# Patient Record
Sex: Female | Born: 1950 | Race: White | Hispanic: No | Marital: Married | Smoking: Never smoker
Health system: Southern US, Community
[De-identification: ages and names within clinical notes are randomized; demographics above are authoritative.]

## PROBLEM LIST (undated history)

## (undated) DIAGNOSIS — F419 Anxiety disorder, unspecified: Secondary | ICD-10-CM

## (undated) DIAGNOSIS — K589 Irritable bowel syndrome without diarrhea: Secondary | ICD-10-CM

## (undated) DIAGNOSIS — I10 Essential (primary) hypertension: Secondary | ICD-10-CM

## (undated) DIAGNOSIS — R519 Headache, unspecified: Secondary | ICD-10-CM

## (undated) DIAGNOSIS — E785 Hyperlipidemia, unspecified: Secondary | ICD-10-CM

## (undated) DIAGNOSIS — E039 Hypothyroidism, unspecified: Secondary | ICD-10-CM

## (undated) DIAGNOSIS — M1990 Unspecified osteoarthritis, unspecified site: Secondary | ICD-10-CM

## (undated) DIAGNOSIS — C801 Malignant (primary) neoplasm, unspecified: Secondary | ICD-10-CM

## (undated) HISTORY — PX: BREAST EXCISIONAL BIOPSY: SUR124

## (undated) HISTORY — PX: BREAST BIOPSY: SHX20

## (undated) HISTORY — PX: FOOT SURGERY: SHX648

## (undated) HISTORY — PX: JOINT REPLACEMENT: SHX530

## (undated) HISTORY — PX: BREAST CYST ASPIRATION: SHX578

---

## 2004-06-11 ENCOUNTER — Ambulatory Visit: Payer: Self-pay | Admitting: Unknown Physician Specialty

## 2004-08-07 ENCOUNTER — Ambulatory Visit: Payer: Self-pay | Admitting: Podiatry

## 2004-08-14 ENCOUNTER — Ambulatory Visit: Payer: Self-pay | Admitting: Podiatry

## 2004-08-28 ENCOUNTER — Emergency Department: Payer: Self-pay | Admitting: Emergency Medicine

## 2004-09-18 ENCOUNTER — Emergency Department (HOSPITAL_COMMUNITY): Admission: EM | Admit: 2004-09-18 | Discharge: 2004-09-19 | Payer: Self-pay | Admitting: Emergency Medicine

## 2004-09-27 ENCOUNTER — Ambulatory Visit (HOSPITAL_COMMUNITY): Admission: RE | Admit: 2004-09-27 | Discharge: 2004-09-27 | Payer: Self-pay | Admitting: Gastroenterology

## 2005-07-08 ENCOUNTER — Ambulatory Visit: Payer: Self-pay | Admitting: Unknown Physician Specialty

## 2006-07-13 ENCOUNTER — Ambulatory Visit: Payer: Self-pay | Admitting: Unknown Physician Specialty

## 2007-09-14 ENCOUNTER — Ambulatory Visit: Payer: Self-pay | Admitting: Unknown Physician Specialty

## 2008-09-12 ENCOUNTER — Ambulatory Visit: Payer: Self-pay | Admitting: Unknown Physician Specialty

## 2008-09-20 ENCOUNTER — Ambulatory Visit: Payer: Self-pay | Admitting: Unknown Physician Specialty

## 2008-11-06 ENCOUNTER — Ambulatory Visit: Payer: Self-pay | Admitting: Cardiology

## 2008-11-06 ENCOUNTER — Ambulatory Visit: Payer: Self-pay | Admitting: General Practice

## 2008-11-20 ENCOUNTER — Ambulatory Visit: Payer: Self-pay | Admitting: General Practice

## 2009-08-18 ENCOUNTER — Ambulatory Visit: Payer: Self-pay

## 2009-09-21 ENCOUNTER — Ambulatory Visit: Payer: Self-pay | Admitting: General Practice

## 2009-10-02 ENCOUNTER — Ambulatory Visit: Payer: Self-pay | Admitting: Unknown Physician Specialty

## 2009-12-04 ENCOUNTER — Ambulatory Visit: Payer: Self-pay | Admitting: Gastroenterology

## 2009-12-06 LAB — PATHOLOGY REPORT

## 2010-11-21 ENCOUNTER — Ambulatory Visit: Payer: Self-pay | Admitting: Unknown Physician Specialty

## 2011-12-29 ENCOUNTER — Ambulatory Visit: Payer: Self-pay | Admitting: Physician Assistant

## 2012-08-22 ENCOUNTER — Emergency Department: Payer: Self-pay | Admitting: Emergency Medicine

## 2012-08-22 LAB — CK TOTAL AND CKMB (NOT AT ARMC)
CK, Total: 80 U/L (ref 21–215)
CK-MB: <0.5 ng/mL — ABNORMAL LOW (ref 0.5–3.6)

## 2012-08-22 LAB — CBC
MCH: 30.8 pg (ref 26.0–34.0)
MCV: 90 fL (ref 80–100)
RBC: 4.61 10*6/uL (ref 3.80–5.20)
RDW: 12.7 % (ref 11.5–14.5)
WBC: 12.3 10*3/uL — ABNORMAL HIGH (ref 3.6–11.0)

## 2012-08-22 LAB — COMPREHENSIVE METABOLIC PANEL
Albumin: 3.7 g/dL (ref 3.4–5.0)
BUN: 25 mg/dL — ABNORMAL HIGH (ref 7–18)
Chloride: 107 mmol/L (ref 98–107)
Creatinine: 0.94 mg/dL (ref 0.60–1.30)
Glucose: 115 mg/dL — ABNORMAL HIGH (ref 65–99)
Potassium: 2.9 mmol/L — ABNORMAL LOW (ref 3.5–5.1)
SGPT (ALT): 20 U/L (ref 12–78)
Total Protein: 6.8 g/dL (ref 6.4–8.2)

## 2013-01-04 ENCOUNTER — Ambulatory Visit: Payer: Self-pay | Admitting: Physician Assistant

## 2013-04-27 ENCOUNTER — Ambulatory Visit: Payer: Self-pay | Admitting: General Practice

## 2013-04-27 LAB — CBC
HCT: 41 % (ref 35.0–47.0)
HGB: 13.6 g/dL (ref 12.0–16.0)
MCH: 30.4 pg (ref 26.0–34.0)
MCHC: 33.2 g/dL (ref 32.0–36.0)
MCV: 91 fL (ref 80–100)
PLATELETS: 295 10*3/uL (ref 150–440)
RBC: 4.49 10*6/uL (ref 3.80–5.20)
RDW: 12.4 % (ref 11.5–14.5)
WBC: 7.5 10*3/uL (ref 3.6–11.0)

## 2013-04-27 LAB — URINALYSIS, COMPLETE
BILIRUBIN, UR: NEGATIVE
BLOOD: NEGATIVE
Glucose,UR: NEGATIVE mg/dL (ref 0–75)
Ketone: NEGATIVE
Leukocyte Esterase: NEGATIVE
NITRITE: NEGATIVE
Ph: 7 (ref 4.5–8.0)
Protein: NEGATIVE
SPECIFIC GRAVITY: 1.006 (ref 1.003–1.030)
WBC UR: 1 /HPF (ref 0–5)

## 2013-04-27 LAB — BASIC METABOLIC PANEL
ANION GAP: 5 — AB (ref 7–16)
BUN: 19 mg/dL — ABNORMAL HIGH (ref 7–18)
CO2: 29 mmol/L (ref 21–32)
CREATININE: 0.91 mg/dL (ref 0.60–1.30)
Calcium, Total: 9 mg/dL (ref 8.5–10.1)
Chloride: 104 mmol/L (ref 98–107)
EGFR (African American): >60
Glucose: 91 mg/dL (ref 65–99)
OSMOLALITY: 278 (ref 275–301)
Potassium: 4 mmol/L (ref 3.5–5.1)
SODIUM: 138 mmol/L (ref 136–145)

## 2013-04-27 LAB — PROTIME-INR
INR: 0.9
PROTHROMBIN TIME: 12.3 s (ref 11.5–14.7)

## 2013-04-27 LAB — MRSA PCR SCREENING

## 2013-04-27 LAB — APTT: ACTIVATED PTT: 29.8 s (ref 23.6–35.9)

## 2013-04-27 LAB — SEDIMENTATION RATE: ERYTHROCYTE SED RATE: 8 mm/h (ref 0–30)

## 2013-04-28 LAB — URINE CULTURE

## 2013-05-11 ENCOUNTER — Inpatient Hospital Stay: Payer: Self-pay | Admitting: General Practice

## 2013-05-12 LAB — PLATELET COUNT: Platelet: 231 10*3/uL (ref 150–440)

## 2013-05-12 LAB — HEMOGLOBIN: HGB: 11 g/dL — ABNORMAL LOW (ref 12.0–16.0)

## 2013-05-12 LAB — BASIC METABOLIC PANEL
Anion Gap: 6 — ABNORMAL LOW (ref 7–16)
BUN: 12 mg/dL (ref 7–18)
CO2: 27 mmol/L (ref 21–32)
CREATININE: 0.84 mg/dL (ref 0.60–1.30)
Calcium, Total: 7.9 mg/dL — ABNORMAL LOW (ref 8.5–10.1)
Chloride: 103 mmol/L (ref 98–107)
EGFR (African American): >60
EGFR (Non-African Amer.): >60
Glucose: 106 mg/dL — ABNORMAL HIGH (ref 65–99)
OSMOLALITY: 272 (ref 275–301)
POTASSIUM: 3.4 mmol/L — AB (ref 3.5–5.1)
Sodium: 136 mmol/L (ref 136–145)

## 2013-05-13 LAB — BASIC METABOLIC PANEL
ANION GAP: 5 — AB (ref 7–16)
BUN: 6 mg/dL — AB (ref 7–18)
CREATININE: 0.85 mg/dL (ref 0.60–1.30)
Calcium, Total: 7.8 mg/dL — ABNORMAL LOW (ref 8.5–10.1)
Chloride: 103 mmol/L (ref 98–107)
Co2: 26 mmol/L (ref 21–32)
EGFR (African American): >60
EGFR (Non-African Amer.): >60
GLUCOSE: 114 mg/dL — AB (ref 65–99)
Osmolality: 267 (ref 275–301)
POTASSIUM: 3.5 mmol/L (ref 3.5–5.1)
Sodium: 134 mmol/L — ABNORMAL LOW (ref 136–145)

## 2013-05-13 LAB — HEMOGLOBIN: HGB: 10.6 g/dL — ABNORMAL LOW (ref 12.0–16.0)

## 2013-05-13 LAB — PLATELET COUNT: PLATELETS: 211 10*3/uL (ref 150–440)

## 2013-06-03 ENCOUNTER — Ambulatory Visit
Admission: RE | Admit: 2013-06-03 | Discharge: 2013-06-03 | Disposition: A | Discharge status: Home / Self Care | Payer: BC Managed Care – PPO | Source: Ambulatory Visit | Attending: Gastroenterology | Admitting: Gastroenterology

## 2013-06-03 ENCOUNTER — Other Ambulatory Visit: Payer: Self-pay | Admitting: Gastroenterology

## 2013-06-03 DIAGNOSIS — R197 Diarrhea, unspecified: Secondary | ICD-10-CM

## 2013-06-03 DIAGNOSIS — R109 Unspecified abdominal pain: Secondary | ICD-10-CM

## 2013-06-03 MED ORDER — IOHEXOL 300 MG/ML  SOLN
100.0000 mL | Freq: Once | INTRAMUSCULAR | Status: AC | PRN
  Administered 2013-06-03: 100 mL via INTRAVENOUS

## 2013-08-04 ENCOUNTER — Ambulatory Visit: Payer: Self-pay | Admitting: General Practice

## 2013-08-04 LAB — PROTIME-INR
INR: 1
Prothrombin Time: 12.6 secs (ref 11.5–14.7)

## 2013-08-04 LAB — BASIC METABOLIC PANEL
ANION GAP: 8 (ref 7–16)
BUN: 16 mg/dL (ref 7–18)
CHLORIDE: 105 mmol/L (ref 98–107)
CREATININE: 1.04 mg/dL (ref 0.60–1.30)
Calcium, Total: 8.7 mg/dL (ref 8.5–10.1)
Co2: 26 mmol/L (ref 21–32)
EGFR (African American): >60
GFR CALC NON AF AMER: 58 — AB
GLUCOSE: 88 mg/dL (ref 65–99)
Osmolality: 278 (ref 275–301)
Potassium: 4.1 mmol/L (ref 3.5–5.1)
Sodium: 139 mmol/L (ref 136–145)

## 2013-08-04 LAB — CBC
HCT: 39.8 % (ref 35.0–47.0)
HGB: 12.7 g/dL (ref 12.0–16.0)
MCH: 29 pg (ref 26.0–34.0)
MCHC: 32 g/dL (ref 32.0–36.0)
MCV: 91 fL (ref 80–100)
Platelet: 360 10*3/uL (ref 150–440)
RBC: 4.39 10*6/uL (ref 3.80–5.20)
RDW: 13.2 % (ref 11.5–14.5)
WBC: 8.2 10*3/uL (ref 3.6–11.0)

## 2013-08-04 LAB — URINALYSIS, COMPLETE
BACTERIA: NONE SEEN
BLOOD: NEGATIVE
Bilirubin,UR: NEGATIVE
Glucose,UR: NEGATIVE mg/dL (ref 0–75)
KETONE: NEGATIVE
LEUKOCYTE ESTERASE: NEGATIVE
Nitrite: NEGATIVE
Ph: 6 (ref 4.5–8.0)
Protein: NEGATIVE
Specific Gravity: 1.013 (ref 1.003–1.030)

## 2013-08-04 LAB — APTT: Activated PTT: 29 secs (ref 23.6–35.9)

## 2013-08-04 LAB — SEDIMENTATION RATE: Erythrocyte Sed Rate: 18 mm/hr (ref 0–30)

## 2013-08-04 LAB — MRSA PCR SCREENING

## 2013-08-05 LAB — URINE CULTURE

## 2013-08-15 ENCOUNTER — Inpatient Hospital Stay: Payer: Self-pay | Admitting: General Practice

## 2013-08-16 LAB — BASIC METABOLIC PANEL
Anion Gap: 10 (ref 7–16)
BUN: 9 mg/dL (ref 7–18)
CALCIUM: 7.4 mg/dL — AB (ref 8.5–10.1)
CHLORIDE: 101 mmol/L (ref 98–107)
CREATININE: 0.89 mg/dL (ref 0.60–1.30)
Co2: 22 mmol/L (ref 21–32)
EGFR (African American): >60
Glucose: 112 mg/dL — ABNORMAL HIGH (ref 65–99)
Osmolality: 266 (ref 275–301)
Potassium: 3.7 mmol/L (ref 3.5–5.1)
Sodium: 133 mmol/L — ABNORMAL LOW (ref 136–145)

## 2013-08-16 LAB — HEMOGLOBIN: HGB: 10.7 g/dL — AB (ref 12.0–16.0)

## 2013-08-16 LAB — PLATELET COUNT: PLATELETS: 241 10*3/uL (ref 150–440)

## 2013-08-17 LAB — BASIC METABOLIC PANEL
Anion Gap: 6 — ABNORMAL LOW (ref 7–16)
BUN: 5 mg/dL — AB (ref 7–18)
CHLORIDE: 98 mmol/L (ref 98–107)
Calcium, Total: 8.1 mg/dL — ABNORMAL LOW (ref 8.5–10.1)
Co2: 29 mmol/L (ref 21–32)
Creatinine: 0.85 mg/dL (ref 0.60–1.30)
EGFR (African American): >60
EGFR (Non-African Amer.): >60
Glucose: 126 mg/dL — ABNORMAL HIGH (ref 65–99)
OSMOLALITY: 265 (ref 275–301)
Potassium: 3.1 mmol/L — ABNORMAL LOW (ref 3.5–5.1)
Sodium: 133 mmol/L — ABNORMAL LOW (ref 136–145)

## 2013-08-17 LAB — PLATELET COUNT: Platelet: 284 10*3/uL (ref 150–440)

## 2013-08-17 LAB — HEMOGLOBIN: HGB: 11.3 g/dL — AB (ref 12.0–16.0)

## 2013-08-18 LAB — BASIC METABOLIC PANEL
ANION GAP: 9 (ref 7–16)
BUN: 8 mg/dL (ref 7–18)
CHLORIDE: 102 mmol/L (ref 98–107)
CO2: 27 mmol/L (ref 21–32)
Calcium, Total: 8.2 mg/dL — ABNORMAL LOW (ref 8.5–10.1)
Creatinine: 1.1 mg/dL (ref 0.60–1.30)
EGFR (African American): >60
EGFR (Non-African Amer.): 54 — ABNORMAL LOW
GLUCOSE: 101 mg/dL — AB (ref 65–99)
Osmolality: 274 (ref 275–301)
POTASSIUM: 3.4 mmol/L — AB (ref 3.5–5.1)
Sodium: 138 mmol/L (ref 136–145)

## 2014-02-22 ENCOUNTER — Ambulatory Visit: Payer: Self-pay | Admitting: Physician Assistant

## 2014-02-28 ENCOUNTER — Ambulatory Visit: Payer: Self-pay | Admitting: Physician Assistant

## 2014-04-29 NOTE — Op Note (Signed)
PATIENT NAME:  Sonya Gomez, Sonya Gomez MR#:  [id number] DATE OF BIRTH:  12-07-1950  DATE OF PROCEDURE:  08/15/2013  PREOPERATIVE DIAGNOSIS: Degenerative arthrosis of the left knee.   POSTOPERATIVE DIAGNOSIS: Degenerative arthrosis of the left knee.   PROCEDURE PERFORMED: Left total knee arthroplasty using computer-assisted navigation.   SURGEON: Skip Estimable, M.D.   ASSISTANT: Vance Peper, PA (required to maintain retraction throughout the procedure)   ANESTHESIA: Spinal.   ESTIMATED BLOOD LOSS: 50 mL.   FLUIDS REPLACED: 1300 mL of crystalloid.   TOURNIQUET TIME: 84 minutes.   DRAINS: Two medium drains to reinfusion system.   SOFT TISSUE RELEASES: Anterior cruciate ligament, posterior cruciate ligament, deep and superficial medial collateral ligament, and patellofemoral ligament.   IMPLANTS UTILIZED: DePuy PFC Sigma size 3 posterior stabilized femoral component (cemented), size 2.5 MBT tibial component (cemented), 35 mm three-peg oval dome patella (cemented), and a 10 mm stabilized rotating platform polyethylene insert.   INDICATIONS FOR SURGERY: The patient is a 64 year old female who has been seen for complaints of progressive left knee pain. The patient previously underwent right total knee arthroplasty for degenerative arthrosis with excellent relief of her pain. Radiographs of the left knee demonstrated degenerative changes in tricompartmental fashion with relative varus deformity. After discussion of the risks and benefits of surgical intervention, the patient expressed understanding of the risks and benefits and agreed with plans for surgical intervention.   PROCEDURE IN DETAIL: The patient was brought into the operating room and, after adequate spinal anesthesia was achieved, a tourniquet was placed on the patient's upper left thigh. The patient's left knee and leg were cleaned and prepped with alcohol and DuraPrep and draped in the usual sterile fashion. A "timeout" was performed as per  usual protocol. The left lower extremity was exsanguinated using an Esmarch and the tourniquet was inflated to 300 mmHg. An anterior longitudinal incision was made followed by a standard mid vastus approach. A small effusion was evacuated. The deep fibers of the medial collateral ligament were elevated in subperiosteal fashion off the medial flare of the tibia so as to maintain a continuous soft tissue sleeve. The patella was subluxed laterally and the patellofemoral ligament was incised. Inspection of the knee demonstrated severe degenerative changes with full-thickness loss of articular cartilage to the medial compartment as well as lesser degenerative changes to the patellofemoral and lateral compartments. Osteophytes were debrided using a rongeur. Anterior and posterior cruciate ligaments were excised. Two 4.0 mm Schanz pins were inserted into the femur and into the tibia for attachment of the array of trackers used for computer-assisted navigation. Hip center was identified using a circumduction technique. Distal landmarks were mapped using the computer. The distal femur and proximal tibia were mapped using the computer. Distal femoral cutting guide was positioned using computer-assisted navigation so as to achieve a 5 degree distal valgus cut. The cut was performed and verified using the computer. Distal femur was sized and it was felt that a size 3 femoral component was appropriate. A size 3 cutting guide was positioned and anterior cut was performed and verified using the computer. This was followed by completion of the posterior and chamfer cuts. Femoral cutting guide for the central box was then positioned and the central box cut was performed. Attention was then directed to the proximal tibia. Medial and lateral menisci were excised. The extramedullary tibial cutting guide was positioned using computer-assisted navigation so as to achieve 0 degree varus valgus alignment and 0 degree posterior slope. Cut  was performed and  verified using the computer. The proximal tibia was sized and it was felt that a size 2.5 tibial tray was appropriate. Tibial and femoral trials were inserted followed by insertion of a 10 mm polyethylene trial. The knee was felt to be tight medially. A Cobb elevator was used to elevate the superficial fibers of the medial collateral ligament. This allowed for excellent mediolateral soft tissue balancing both in full extension and in flexion. Finally, the patella was cut and prepared so as to accommodate a 35 mm three-peg oval dome patella. Patellar trial was placed and the knee was placed through a range of motion with excellent patellar tracking appreciated. The femoral trial was removed. Central post hole for the tibial component was reamed followed by insertion of a keel punch. Tibial trial was then removed. The cut surfaces of bone were irrigated with copious amounts of normal saline with antibiotic solution using pulsatile lavage and then suctioned dry. Polymethyl methacrylate cement was prepared in the usual fashion using a vacuum mixer. Cement was applied to the cut surface of the proximal tibia as well as along the undersurface of a size 2.5 MBT tibial component. The tibial component was positioned and impacted into place. Excess cement was removed using [employer] fast streamer. Cement was then applied to the cut surface of the femur as well as along the posterior flanges of a size 3 posterior stabilized femoral component. Femoral component was positioned and impacted into place. Excess cement was removed using freer elevators. A 10 mm polyethylene trial was inserted and the knee was brought into full extension with steady axial compression applied. Finally, cement was applied to the backside of a 35 mm three-peg oval dome patella and the patellar component was positioned and patellar clamp applied. Excess cement was removed using [employer] fast streamer.   After adequate curing of cement, the  tourniquet was deflated after total tourniquet time of 84 minutes. Hemostasis was achieved using electrocautery. The knee was irrigated with copious amounts of normal saline with antibiotic solution using pulsatile lavage and then suctioned dry. The knee was inspected for any residual cement debris. Then 20 mL of 1.3% Exparel in 40 mL of normal saline was injected along the posterior capsule, medial and lateral gutters, and along the arthrotomy site. A 10 mm stabilized rotating platform polyethylene insert was inserted and the knee was placed through a range of motion with excellent patellar tracking appreciated and excellent mediolateral soft tissue balancing noted. Two medium drains were placed in the wound bed and brought out through a separate stab incision to be attached to a reinfusion system. The medial parapatellar portion of the incision was reapproximated using interrupted sutures of #1 Vicryl. The subcutaneous tissue was approximated in layers using first #0 Vicryl followed by #2-0 Vicryl. Then 30 mL of 0.25% Marcaine with epinephrine was injected along the incision site in the subcutaneous tissue. Skin was closed with skin staples. A sterile dressing was applied.   The patient tolerated the procedure well. She was transported to the recovery room in stable condition. ____________________________ Laurice Record. Holley Bouche., MD jph:sb D: 08/15/2013 11:27:20 ET T: 08/15/2013 11:47:41 ET JOB#: [id number]  cc: Jeneen Rinks P. Holley Bouche., MD, <Dictator> Laurice Record Holley Bouche MD ELECTRONICALLY SIGNED 08/17/2013 21:47

## 2014-04-29 NOTE — Op Note (Signed)
PATIENT NAME:  Sonya Gomez, Sonya Gomez MR#:  [id number] DATE OF BIRTH:  Jan 09, 1950  DATE OF PROCEDURE:  05/11/2013  PREOPERATIVE DIAGNOSIS: Degenerative arthrosis of the right knee.   POSTOPERATIVE DIAGNOSIS: Degenerative arthrosis of the right knee.   PROCEDURE PERFORMED: Right total knee arthroplasty using computer-assisted navigation.   SURGEON: Dr. Skip Estimable  ASSISTANT:  Vance Peper, PA (required to maintain retraction throughout the procedure)  ANESTHESIA: Spinal.   ESTIMATED BLOOD LOSS: 50 mL.   FLUIDS REPLACED: 1500 mL of crystalloid.   TOURNIQUET TIME: 115 minutes.   DRAINS: Two medium drains, reinfusion system.   SOFT TISSUE RELEASES: Anterior cruciate ligament, posterior cruciate ligament, deep and superficial medial collateral ligament, patellofemoral ligament.   IMPLANTS UTILIZED: DePuy PFC Sigma size 2.5 posterior stabilized femoral component (cemented), size 2.5 MBT tibial component (cemented), 32 mm 3 peg oval dome patella (cemented), and a 10 mm stabilized rotating platform polyethylene insert.   INDICATIONS FOR SURGERY: The patient is a 64 year old female who has been seen for complaints of progressive right knee pain. X-rays demonstrated severe degenerative changes in tricompartmental fashion with slight varus deformity. After discussion of the risks and benefits of surgical intervention, the patient expressed understanding of the risks and benefits and agreed with plans for surgical intervention.   PROCEDURE IN DETAIL: The patient was brought into the operating room and, after adequate spinal anesthesia was achieved, a tourniquet was placed on the patient's upper right thigh. The patient's right knee and leg were cleaned and prepped with alcohol and DuraPrep draped in the usual sterile fashion. A "timeout" was performed as per usual protocol. The right lower extremity was exsanguinated using an Esmarch, the tourniquet was inflated to 300 mmHg. An anterior longitudinal incision  was made followed by a standard mid vastus approach. A moderate effusion was evacuated. The deep fibers of the medial collateral ligament were elevated in a subperiosteal fashion off the medial flare of the tibia so as to maintain a continuous soft tissue sleeve. The patella was subluxed laterally and the patellofemoral ligament was incised. Inspection of the knee demonstrated severe degenerative changes in tricompartmental fashion with full-thickness loss of articular cartilage noted. Prominent osteophytes were debrided using a rongeur. Anterior and posterior cruciate ligaments were excised. Two 4.0 mm Schanz pins were inserted into the femur and into the tibia for attachment of the array of trackers used for computer-assisted navigation. Hip center was identified using circumduction technique. Distal landmarks were mapped using the computer. The distal femur and proximal tibia were mapped using the computer. Distal femoral cutting guide was positioned using computer-assisted navigation so as to achieve a 5 degree distal valgus cut. Cut was performed and verified using the computer. Distal femur was sized and it was felt that a size 2.5 femur was appropriate.  A size 2.5 cutting guide was positioned and the anterior cut was performed and verified using the computer. This was followed by completion of the posterior and chamfer cuts. Femoral cutting guide for the central box was then positioned and the central box cut was performed. Attention was then directed to the proximal tibia. Medial and lateral menisci were excised. The extramedullary tibial cutting guide was positioned using computer-assisted navigation so as to achieve 0 degree varus and valgus alignment and 0 degree posterior slope. Cut was performed and verified using the computer. The proximal tibia was sized and it was felt that a size 2.5 tibial tray was appropriate. Tibial and femoral trials were inserted followed by insertion of a 10 mm polyethylene  trial. The knee was felt to be tight medially. A Cobb elevator was used to elevate the superficial fibers of the medial collateral ligament. This improved soft tissue balance, but there was still felt to be a varus alignment appreciated as per computer readings. Trial components were removed and the bony ends were placed end to end. Additional verification was performed of the tibial cut and of the distal femoral cut. Distal femoral cut unlike initial findings show that there was slight varus alignment consistent with the varus alignment noted with the implants place. The distal femoral cutting guide was repositioned using computer-assisted navigation and thin wafer of bone was removed from the lateral femoral condyle.  Three-in-one cutting block was repositioned and the chamfer cuts were recut.  The box cut was also recut. Bones were placed end to end and excellent alignment was appreciated with 0 degrees noted. Trial components were reinserted followed by insertion of a 10 mm polyethylene trial. Excellent alignment was appreciated and excellent mediolateral soft tissue balance was appreciated.  Finally, the  patella was cut and prepared so as to accommodate a 32 mm 3 peg oval dome patella. Patellar trial was placed and the knee was placed through a range of motion with excellent patellar tracking appreciated.   Femoral trial was removed. Central post hole for the tibial component was reamed followed by insertion of a keel punch. Tibial trial was then removed. Cut surfaces of bone were irrigated with copious amounts of normal saline with antibiotic solution using pulsatile lavage and then suctioned dry. Polymethyl methacrylate cement was prepared in the usual fashion using a vacuum mixer. Cement was applied to the cut surface of the proximal tibia as well as along the under surface of size 2.5 MBT tibial component. The tibial component was positioned and impacted into place. Excess cement was removed using Air cabin crew. Cement was then applied to the cut surface of the femur as well as on the posterior phalanges of a size 2.5 posterior stabilized femoral component.  The femoral component was positioned and impacted into place. Excess cement was removed using [employer] fast streamer. A 10 mm polyethylene insert was placed and it was brought into full extension with steady axial compression applied. Finally, cement was applied to the backside of a 32 mm 3 peg oval dome patella and the patellar component was positioned and patellar clamp applied. Excess cement was removed using [employer] fast streamer.   After adequate curing of cement, the tourniquet was deflated after a total tourniquet time of 115 minutes. Hemostasis was achieved using electrocautery. The knee was irrigated with copious amounts of normal saline with antibiotic solution using pulsatile lavage and suctioned dry. The knee was inspected for any residual cement debris, 20 mL of 1.3% Exparel and 40 mL of normal saline was injected along the posterior capsule, medial and lateral gutters, and along the arthrotomy site. A 10 mm stabilized rotating platform polyethylene insert was inserted and the knee was placed through a range of motion with excellent patellar tracking appreciated and excellent mediolateral soft tissue balancing noted both in full extension and in flexion. Two medium drains were placed in the wound bed and brought in through a separate stab incision to be attached to a reinfusion system. The medial parapatellar portion of the incision was reapproximated using interrupted sutures of #1 Vicryl. The subcutaneous tissue was approximated in layers using first #0 Vicryl, followed by 2-0 Vicryl. Then, 30 mL of 0.25% Marcaine with epinephrine was injected into the subcutaneous tissue in line  with the skin incision. The skin was then reapproximated using skin staples. Sterile dressing was applied. The patient tolerated the procedure well. She was transported to the  recovery room in stable condition.    ____________________________ Laurice Record. Holley Bouche., MD jph:dd/am D: 05/11/2013 16:00:00 ET T: 05/12/2013 00:27:27 ET JOB#: [id number]  cc: Laurice Record. Holley Bouche., MD, <Dictator> JAMES P Holley Bouche MD ELECTRONICALLY SIGNED 05/15/2013 23:10

## 2014-04-29 NOTE — Discharge Summary (Signed)
PATIENT NAME:  CHE, BELOW MR#:  [id number] DATE OF BIRTH:  1950-07-08  DATE OF ADMISSION:  05/11/2013 DATE OF DISCHARGE:  05/14/2013  ADMITTING DIAGNOSIS: Degenerative arthrosis of the right knee.   DISCHARGE DIAGNOSIS: Degenerative arthrosis of the right knee.   HISTORY: The patient is a pleasant 64 year old who has been following at [facility] for progression of right knee discomfort. She had undergone a right knee arthroscopy in 2011 for a partial medial meniscectomy as well as chondroplasty. She has also undergone Synvisc injections with only modest improvement. The patient had localized most of her pain along the medial aspect of the knee. Her pain was noted be aggravated with weight-bearing activities. She has also reported some start of stiffness and occasional activity-related swelling. At the time of surgery, she was not using any ambulatory aid. She had not seen any significant improvement in her condition despite the use of Celebrex and occasionally the use of narcotics for pain control. She states that the pain had increased to the point that it was significantly interfering with her activities of daily living. X-rays taken at [facility] showed narrowing of the medial cartilage space with associated varus alignment. She was noted to have osteophyte as well as subchondral sclerosis. After discussion of the risks and benefits of surgical intervention, the patient expressed her understanding of the risks and benefits and agreed for plans for surgical intervention.   PROCEDURE: Right total knee arthroplasty using computer-assisted navigation.   ANESTHESIA: Spinal.   SOFT TISSUE RELEASE: Anterior cruciate ligament, posterior cruciate ligament, deep and superficial medial collateral ligaments, as well as the patellofemoral ligament.   IMPLANTS UTILIZED: DePuy PFC Sigma size 2.5 posterior stabilized femoral component cemented, size 2.5 MBT tibial component cemented,  32-mm 3-pegged oval dome patella cemented, and a 10-mm stabilized rotating platform polyethylene insert.   HOSPITAL COURSE: The patient tolerated the procedure very well. She had no complications. She was then taken to the PACU where she was stabilized then transferred to the orthopedic floor. She began receiving anticoagulation therapy of Lovenox 30 mg subcutaneous q. 12 hours per anesthesia and pharmacy protocol. She was fitted with TED stockings bilaterally. These were allowed to be removed 1 hour per 8-hour shift. The right one was applied on day 2 following removal of the Hemovac and dressing change. The wound was free of any drainage or signs of infection. The patient was also fitted with the AV-I compression foot pumps bilaterally set at 80 mmHg. Her calves have been nontender. There has been no evidence of any DVTs. Negative Homans sign. Heels were elevated off the bed using rolled towels.   The patient has denied any chest pains or shortness of breath. Vital signs have been stable. She has been afebrile. Hemodynamically she was stable and no transfusions were needed the Autovac transfusions given the first 6 hours postoperatively.   Physical therapy was initiated on day 1 for gait training and transfers. Upon being discharged was ambulating greater than 200 feet. She was independent with bed to chair transfers, was able go up 4 steps. Occupational therapy was also initiated on day 1 for ADL and assistive devices.   The patient's IV, Foley, and Hemovac were discontinued on day 2 along with the dressing change. The Polar Care was reapplied to the surgical leg, maintaining a temperature of 40-50 degrees Fahrenheit. Overall, it has been an unremarkable hospital course.   DRUG ALLERGIES: CECLOR, CRESTOR, TORADOL.   The patient is being discharged to home in improved  stable condition.   DISCHARGE INSTRUCTIONS: She may weight bear as tolerated. Continue using a walker until cleared by physical  therapy to go to a quad cane. She will receive home health PT. She is instructed on elevation of the lower extremity. Continue TED stockings. These are to be worn during the day but may be removed at night. Continue the Polar Care maintaining a temperature of 40-50 degrees Fahrenheit as much as she can for the first 2 weeks around-the-clock. Encouraged incentive spirometer q. 1 while awake, was also encouraged cough, deep breathing q. 2 hours while awake. Placed on a regular diet. She was instructed on wound care. She has a followup appointment on 05/21 at 8:45. She is to call the clinic sooner for any temperatures of 101.5 or greater or excessive bleeding.   The patient may resume her regular medication that she was on prior to admission. She was given a prescription for all hydrocodone 5 -10 mg q. 4-6 hours p.r.n. for pain. Tramadol 50-100 mg every 4-6 hours p.r.n. for pain and Lovenox 40 mg subcutaneously daily for 14 days, then discontinue and begin taking one 81-mg enteric-coated aspirin.   PAST MEDICAL HISTORY: Hypertension, hyperlipidemia, hypothyroidism, anxiety, arthritis, migraines, irritable bowel syndrome, fibrocystic breast disease, diverticulosis, endometriosis, colonic polyps.    ____________________________ Vance Peper, PA jrw:lt D: 05/31/2013 20:41:35 ET T: 05/31/2013 21:23:24 ET JOB#: [id number]  cc: Vance Peper, PA, <Dictator> JON WOLFE PA ELECTRONICALLY SIGNED 06/08/2013 7:02

## 2014-04-29 NOTE — Discharge Summary (Signed)
PATIENT NAME:  Sonya Sonya Gomez, Sonya Gomez MR#:  [id number] DATE OF BIRTH:  [birth year]  DATE OF ADMISSION:  08/15/2013 DATE OF DISCHARGE:  08/18/2013  ADMITTING DIAGNOSIS: Degenerative arthrosis of left knee.   DISCHARGE DIAGNOSIS: Degenerative arthrosis of the left knee.   HISTORY: The patient is a 64 year old who has been followed at [facility] for  discomfort to bilateral knees. She had previously undergone a right total knee arthroplasty and had done extremely well. This was performed on 05/11/2013. However, the left knee had continued to have discomfort. She desired to have surgery on the left knee. She states that she had a long history of progressive left knee pain. The pain was aggravated with weight-bearing activities. She had localized most of the pain along the medial aspect of the knee. On occasion, the patient had noted some swelling as well as near giving way. She had not appreciated any significant improvement in her condition despite the use of Celebrex as well as activity modification. At the time of surgery, she was not using any ambulatory aid. X-rays taken in [place] showed narrowing of the medial cartilage space with associated varus alignment. Osteophyte as well as subchondral sclerosis were noted. Degenerative changes was present to the patellofemoral articulation as well. After discussion of the risks and benefits of surgical intervention, the patient expressed her understanding of the risks and benefits and agreed for plans for surgical intervention.   PROCEDURE: Left total knee arthroplasty using computer-assisted navigation.   ANESTHESIA: Spinal.   SOFT TISSUE RELEASE: Anterior cruciate ligament, posterior cruciate ligament, deep and superficial medial collateral ligament, as well as patellofemoral ligament.   IMPLANTS UTILIZED: DePuy PFC Sigma size 3 posterior stabilized femoral component (cemented), size 2.5, MBT tibial component (cemented), 35 mm 3 pegged,  oval dome patella (cemented), and a 10 mm stabilized rotating platform polyethylene insert.   HOSPITAL COURSE: The patient tolerated the procedure very well. She had no complications. She was then taken to the PAC-U where she was stabilized and then transferred to the orthopedic floor. The patient began receiving anticoagulation therapy of Lovenox 30 mg subcutaneous q.  12 hours per anesthesia and pharmacy protocol. She was fitted with TED stockings bilaterally. These were allowed to be removed 1 hour per 8 hour shift. The left 1 was applied on day #2 following removal of the Hemovac and dressing change. The patient was also fitted with AVI compression foot pumps bilaterally set at 80 mmHg. Her calves have been nontender. There has been no evidence of any DVTs. Negative Homans sign. Heels were elevated off the bed using rolled towels.   The patient has denied any chest pain or shortness of breath. Vital signs have been stable. She has been afebrile. Hemodynamically, she was stable and no transfusions were needed other than the Autovac transfusion given the first 6 hours postoperatively.   The patient began receiving physical therapy on day #1. She has done extremely well. Upon being discharged was ambulating greater than 200 feet. She was independent with bed to chair transfers, was able go up and down 4 steps. Occupational therapy was also initiated on day #1 for ADL assistive devices.   The patient's IV, Foley and Hemovac were discontinued on day #2 along with a dressing change. The wound was free of any drainage or signs of infection. The Polar Care was reapplied to the surgical leg, maintaining a temperature of 40-50 degrees Fahrenheit.   DISPOSITION: The patient is discharged to home in improved and stable condition.  DISCHARGE INSTRUCTIONS: She may continue weight-bearing as tolerated. Continue to use the walker until cleared by physical therapy to go to a quad cane. She will receive home health  PT. She is to continue with TED stockings, bilaterally. These are to be worn during the day, but may be removed at night. Continue Polar Care maintaining a temperature of 40-50 degrees Fahrenheit. Recommend that she wear this around-the-clock as much as she can for the first 2 weeks. Elevate the heels off the bed. Encourage the patient to continue using incentive spirometer q. 1 hour while awake. I also encouraged her to continue cough and deep breathing q. 2 hours while awake. She is placed on a regular diet. She has a follow-up appointment at [facility] for August the 25th at 8:30. She is to call the clinic sooner if any temperatures of 101.5 or greater or excessive bleeding.   DRUG ALLERGIES: Crestor, Ceclor, and Toradol.   The patient may resume her regular medication that she was on prior to admission. She was given a prescription for Norco 5-10 mg every 4-6 hours p.r.n. for pain as well as tramadol 50-100 mg every 4-6 hours p.r.n. for pain. Also a prescription for Lovenox 40 mg subcutaneously daily for 14 days, then discontinue and begin taking one 81 mg enteric-coated aspirin.   PAST MEDICAL HISTORY: Hypertension, hyperlipidemia, hypothyroidism, anxiety, arthritis, migraine, irritable bowel syndrome, fibrocystic breast disease, diverticulosis, endometriosis, history of colonic polyps    ____________________________ Vance Peper, PA jrw:[id number] D: 08/26/2013 08:12:00 ET T: 08/26/2013 08:38:19 ET JOB#: [id number]  cc: Vance Peper, PA, <Dictator> [name] PA ELECTRONICALLY SIGNED 09/01/2013 8:50

## 2015-02-21 ENCOUNTER — Other Ambulatory Visit: Payer: Self-pay | Admitting: Physician Assistant

## 2015-02-21 DIAGNOSIS — R928 Other abnormal and inconclusive findings on diagnostic imaging of breast: Secondary | ICD-10-CM

## 2015-02-21 DIAGNOSIS — Z0000 Encounter for general adult medical examination without abnormal findings: Secondary | ICD-10-CM

## 2015-02-21 DIAGNOSIS — Z1239 Encounter for other screening for malignant neoplasm of breast: Secondary | ICD-10-CM

## 2015-03-07 ENCOUNTER — Other Ambulatory Visit: Payer: Self-pay | Admitting: Physician Assistant

## 2015-03-07 ENCOUNTER — Ambulatory Visit
Admission: RE | Admit: 2015-03-07 | Discharge: 2015-03-07 | Disposition: A | Discharge status: Home / Self Care | Payer: BLUE CROSS/BLUE SHIELD | Source: Ambulatory Visit | Attending: Physician Assistant | Admitting: Physician Assistant

## 2015-03-07 DIAGNOSIS — Z0000 Encounter for general adult medical examination without abnormal findings: Secondary | ICD-10-CM

## 2015-03-07 DIAGNOSIS — N6459 Other signs and symptoms in breast: Secondary | ICD-10-CM | POA: Insufficient documentation

## 2015-03-07 DIAGNOSIS — R928 Other abnormal and inconclusive findings on diagnostic imaging of breast: Secondary | ICD-10-CM

## 2015-03-07 DIAGNOSIS — Z1239 Encounter for other screening for malignant neoplasm of breast: Secondary | ICD-10-CM

## 2015-03-07 HISTORY — DX: Malignant (primary) neoplasm, unspecified: C80.1

## 2015-03-09 ENCOUNTER — Other Ambulatory Visit: Payer: Self-pay | Admitting: Physician Assistant

## 2015-03-09 DIAGNOSIS — R928 Other abnormal and inconclusive findings on diagnostic imaging of breast: Secondary | ICD-10-CM

## 2015-03-15 ENCOUNTER — Ambulatory Visit
Admission: RE | Admit: 2015-03-15 | Discharge: 2015-03-15 | Disposition: A | Discharge status: Home / Self Care | Payer: BLUE CROSS/BLUE SHIELD | Source: Ambulatory Visit | Attending: Physician Assistant | Admitting: Physician Assistant

## 2015-03-15 DIAGNOSIS — N6489 Other specified disorders of breast: Secondary | ICD-10-CM | POA: Diagnosis not present

## 2015-03-15 DIAGNOSIS — R928 Other abnormal and inconclusive findings on diagnostic imaging of breast: Secondary | ICD-10-CM

## 2015-03-15 DIAGNOSIS — N62 Hypertrophy of breast: Secondary | ICD-10-CM | POA: Diagnosis not present

## 2015-03-16 LAB — SURGICAL PATHOLOGY

## 2016-02-21 DIAGNOSIS — E039 Hypothyroidism, unspecified: Secondary | ICD-10-CM | POA: Diagnosis not present

## 2016-02-21 DIAGNOSIS — Z0000 Encounter for general adult medical examination without abnormal findings: Secondary | ICD-10-CM | POA: Diagnosis not present

## 2016-02-21 DIAGNOSIS — I10 Essential (primary) hypertension: Secondary | ICD-10-CM | POA: Diagnosis not present

## 2016-02-21 DIAGNOSIS — E782 Mixed hyperlipidemia: Secondary | ICD-10-CM | POA: Diagnosis not present

## 2016-02-28 ENCOUNTER — Other Ambulatory Visit: Payer: Self-pay | Admitting: Physician Assistant

## 2016-02-28 DIAGNOSIS — Z1231 Encounter for screening mammogram for malignant neoplasm of breast: Secondary | ICD-10-CM

## 2016-02-28 DIAGNOSIS — E039 Hypothyroidism, unspecified: Secondary | ICD-10-CM | POA: Diagnosis not present

## 2016-02-28 DIAGNOSIS — Z0000 Encounter for general adult medical examination without abnormal findings: Secondary | ICD-10-CM | POA: Diagnosis not present

## 2016-02-28 DIAGNOSIS — N6019 Diffuse cystic mastopathy of unspecified breast: Secondary | ICD-10-CM | POA: Diagnosis not present

## 2016-02-28 DIAGNOSIS — Z79890 Hormone replacement therapy (postmenopausal): Secondary | ICD-10-CM | POA: Diagnosis not present

## 2016-02-28 DIAGNOSIS — F4320 Adjustment disorder, unspecified: Secondary | ICD-10-CM | POA: Diagnosis not present

## 2016-02-28 DIAGNOSIS — E782 Mixed hyperlipidemia: Secondary | ICD-10-CM | POA: Diagnosis not present

## 2016-02-28 DIAGNOSIS — I10 Essential (primary) hypertension: Secondary | ICD-10-CM | POA: Diagnosis not present

## 2016-03-27 ENCOUNTER — Ambulatory Visit
Admission: RE | Admit: 2016-03-27 | Discharge: 2016-03-27 | Disposition: A | Discharge status: Home / Self Care | Payer: PPO | Source: Ambulatory Visit | Attending: Physician Assistant | Admitting: Physician Assistant

## 2016-03-27 DIAGNOSIS — Z1231 Encounter for screening mammogram for malignant neoplasm of breast: Secondary | ICD-10-CM | POA: Insufficient documentation

## 2016-03-28 DIAGNOSIS — Z85828 Personal history of other malignant neoplasm of skin: Secondary | ICD-10-CM | POA: Diagnosis not present

## 2016-03-28 DIAGNOSIS — L570 Actinic keratosis: Secondary | ICD-10-CM | POA: Diagnosis not present

## 2016-03-28 DIAGNOSIS — Z08 Encounter for follow-up examination after completed treatment for malignant neoplasm: Secondary | ICD-10-CM | POA: Diagnosis not present

## 2016-03-28 DIAGNOSIS — L718 Other rosacea: Secondary | ICD-10-CM | POA: Diagnosis not present

## 2016-04-30 DIAGNOSIS — H2513 Age-related nuclear cataract, bilateral: Secondary | ICD-10-CM | POA: Diagnosis not present

## 2016-08-21 DIAGNOSIS — E782 Mixed hyperlipidemia: Secondary | ICD-10-CM | POA: Diagnosis not present

## 2016-08-21 DIAGNOSIS — E039 Hypothyroidism, unspecified: Secondary | ICD-10-CM | POA: Diagnosis not present

## 2016-08-21 DIAGNOSIS — I10 Essential (primary) hypertension: Secondary | ICD-10-CM | POA: Diagnosis not present

## 2016-08-27 DIAGNOSIS — Z0000 Encounter for general adult medical examination without abnormal findings: Secondary | ICD-10-CM | POA: Diagnosis not present

## 2016-08-27 DIAGNOSIS — F419 Anxiety disorder, unspecified: Secondary | ICD-10-CM | POA: Diagnosis not present

## 2016-08-27 DIAGNOSIS — I10 Essential (primary) hypertension: Secondary | ICD-10-CM | POA: Diagnosis not present

## 2016-08-27 DIAGNOSIS — E039 Hypothyroidism, unspecified: Secondary | ICD-10-CM | POA: Diagnosis not present

## 2016-08-27 DIAGNOSIS — E782 Mixed hyperlipidemia: Secondary | ICD-10-CM | POA: Diagnosis not present

## 2016-08-27 DIAGNOSIS — M1711 Unilateral primary osteoarthritis, right knee: Secondary | ICD-10-CM | POA: Diagnosis not present

## 2016-12-29 IMAGING — MG MM DIGITAL DIAGNOSTIC UNILAT*R*
2 series · 2 of 2 positions shown · non-contrast
Comparison: Previous exam(s).

CLINICAL DATA: 64-year-old female status post stereotactic guided
biopsy of a right breast asymmetry

EXAM:
DIAGNOSTIC RIGHT MAMMOGRAM POST STEREOTACTIC BIOPSY

[R CC]
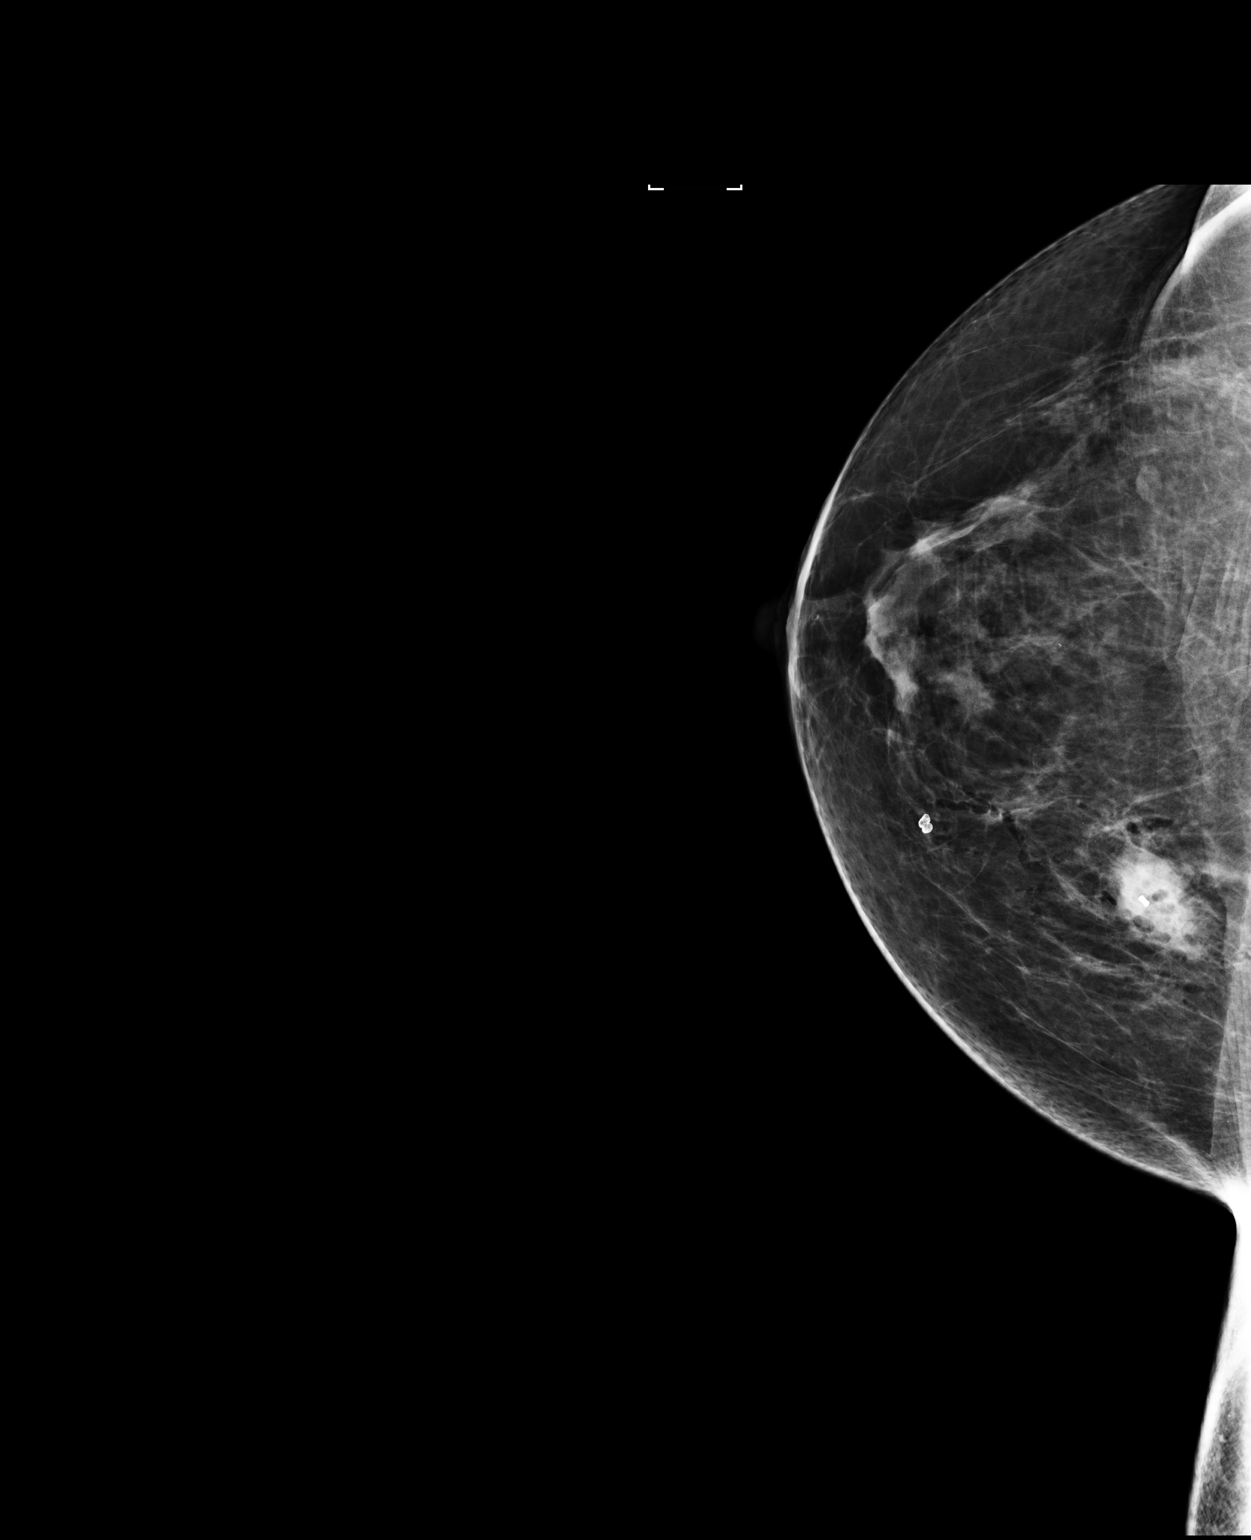

[R ML]
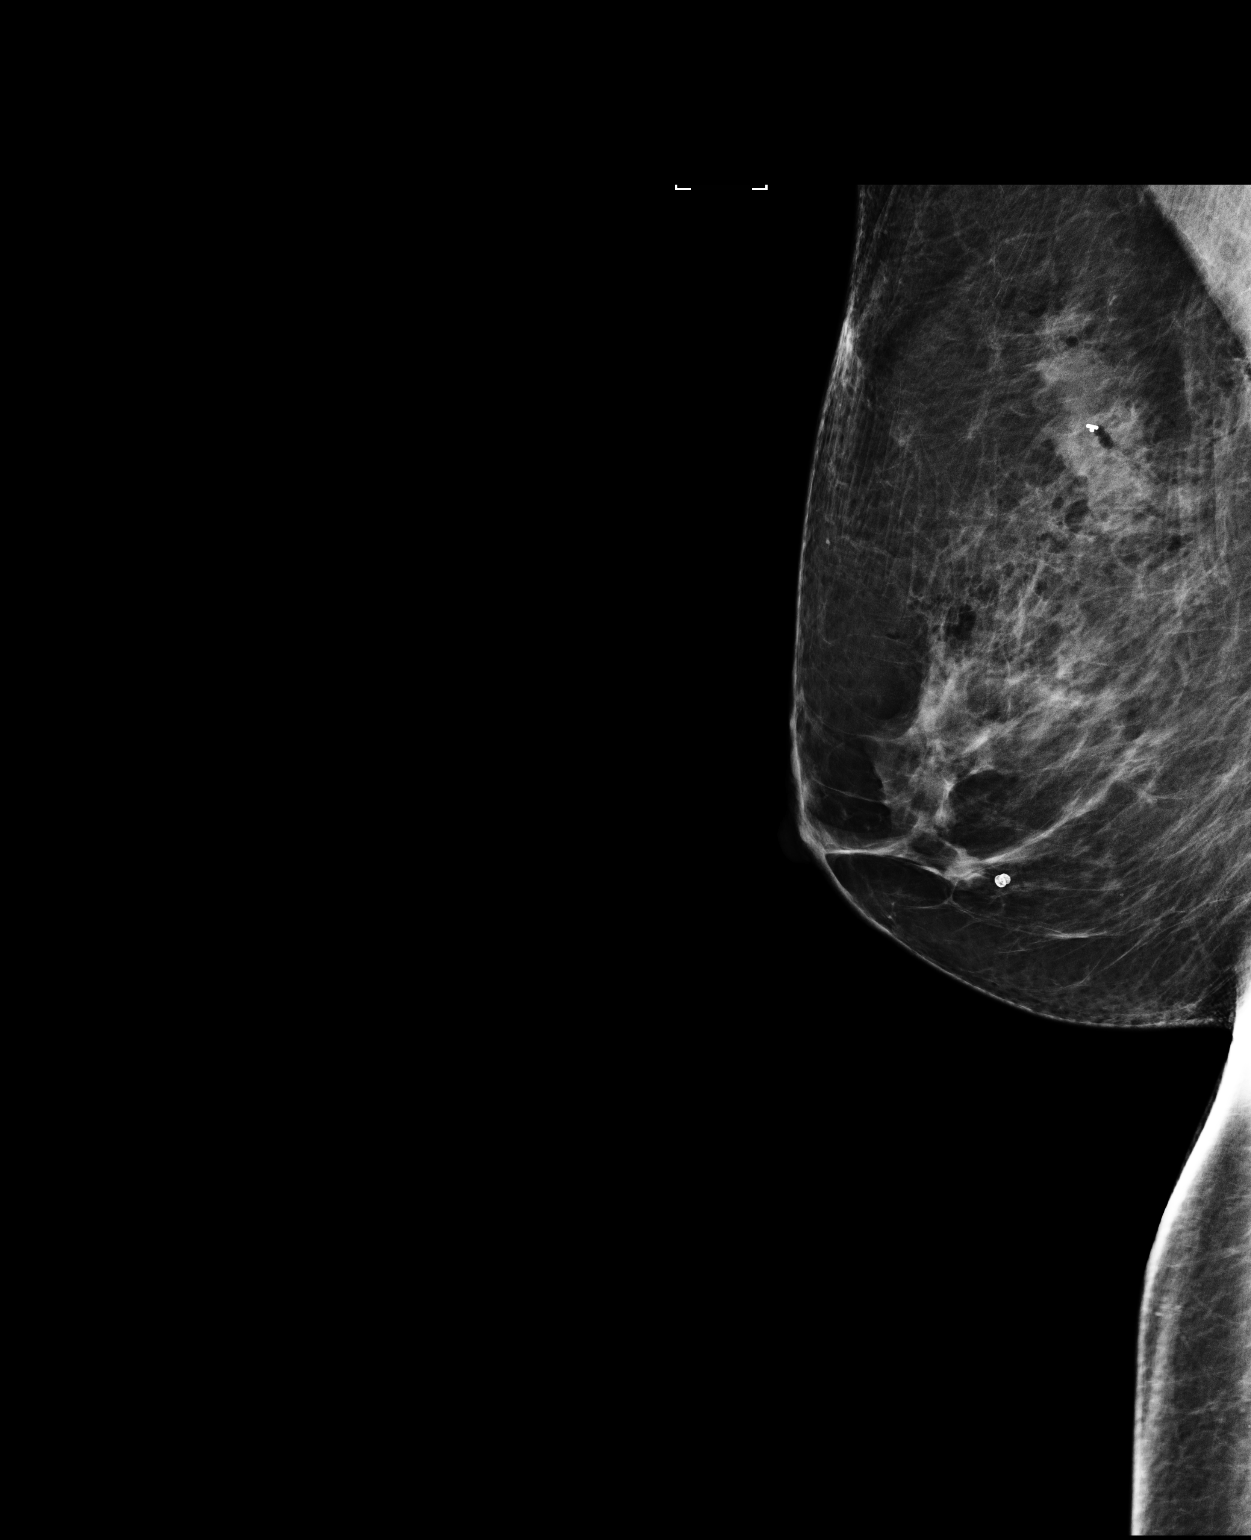

[2 of 2 positions shown; findings below may reference images not displayed]

FINDINGS: Mammographic images were obtained following stereotactic guided
biopsy of an asymmetry within the upper, inner right breast. Post
biopsy mammogram demonstrates the top hat shaped biopsy marker to be
in the expected location within the upper, inner right breast.
IMPRESSION: Satisfactory marker placement post stereotactic guided right breast
biopsy.

Final Assessment: Post Procedure Mammograms for Marker Placement

## 2017-02-26 DIAGNOSIS — E782 Mixed hyperlipidemia: Secondary | ICD-10-CM | POA: Diagnosis not present

## 2017-02-26 DIAGNOSIS — I10 Essential (primary) hypertension: Secondary | ICD-10-CM | POA: Diagnosis not present

## 2017-02-26 DIAGNOSIS — F419 Anxiety disorder, unspecified: Secondary | ICD-10-CM | POA: Diagnosis not present

## 2017-02-26 DIAGNOSIS — E039 Hypothyroidism, unspecified: Secondary | ICD-10-CM | POA: Diagnosis not present

## 2017-03-09 DIAGNOSIS — M545 Low back pain: Secondary | ICD-10-CM | POA: Diagnosis not present

## 2017-03-20 DIAGNOSIS — E782 Mixed hyperlipidemia: Secondary | ICD-10-CM | POA: Diagnosis not present

## 2017-03-20 DIAGNOSIS — Z0000 Encounter for general adult medical examination without abnormal findings: Secondary | ICD-10-CM | POA: Diagnosis not present

## 2017-03-20 DIAGNOSIS — I10 Essential (primary) hypertension: Secondary | ICD-10-CM | POA: Diagnosis not present

## 2017-03-20 DIAGNOSIS — N183 Chronic kidney disease, stage 3 (moderate): Secondary | ICD-10-CM | POA: Diagnosis not present

## 2017-03-20 DIAGNOSIS — Z1231 Encounter for screening mammogram for malignant neoplasm of breast: Secondary | ICD-10-CM | POA: Diagnosis not present

## 2017-03-20 DIAGNOSIS — Z79890 Hormone replacement therapy (postmenopausal): Secondary | ICD-10-CM | POA: Diagnosis not present

## 2017-03-20 DIAGNOSIS — E039 Hypothyroidism, unspecified: Secondary | ICD-10-CM | POA: Diagnosis not present

## 2017-04-01 ENCOUNTER — Other Ambulatory Visit: Payer: Self-pay | Admitting: Physician Assistant

## 2017-04-01 DIAGNOSIS — Z1231 Encounter for screening mammogram for malignant neoplasm of breast: Secondary | ICD-10-CM

## 2017-04-17 ENCOUNTER — Ambulatory Visit
Admission: RE | Admit: 2017-04-17 | Discharge: 2017-04-17 | Disposition: A | Discharge status: Home / Self Care | Payer: PPO | Source: Ambulatory Visit | Attending: Physician Assistant | Admitting: Physician Assistant

## 2017-04-17 DIAGNOSIS — Z1231 Encounter for screening mammogram for malignant neoplasm of breast: Secondary | ICD-10-CM | POA: Insufficient documentation

## 2017-04-27 DIAGNOSIS — N183 Chronic kidney disease, stage 3 (moderate): Secondary | ICD-10-CM | POA: Diagnosis not present

## 2017-04-27 DIAGNOSIS — I10 Essential (primary) hypertension: Secondary | ICD-10-CM | POA: Diagnosis not present

## 2017-05-01 DIAGNOSIS — I10 Essential (primary) hypertension: Secondary | ICD-10-CM | POA: Diagnosis not present

## 2017-06-05 DIAGNOSIS — L718 Other rosacea: Secondary | ICD-10-CM | POA: Diagnosis not present

## 2017-06-05 DIAGNOSIS — L249 Irritant contact dermatitis, unspecified cause: Secondary | ICD-10-CM | POA: Diagnosis not present

## 2017-06-05 DIAGNOSIS — Z85828 Personal history of other malignant neoplasm of skin: Secondary | ICD-10-CM | POA: Diagnosis not present

## 2017-06-05 DIAGNOSIS — Z08 Encounter for follow-up examination after completed treatment for malignant neoplasm: Secondary | ICD-10-CM | POA: Diagnosis not present

## 2017-09-29 DIAGNOSIS — I10 Essential (primary) hypertension: Secondary | ICD-10-CM | POA: Diagnosis not present

## 2017-09-29 DIAGNOSIS — E039 Hypothyroidism, unspecified: Secondary | ICD-10-CM | POA: Diagnosis not present

## 2017-10-01 DIAGNOSIS — E782 Mixed hyperlipidemia: Secondary | ICD-10-CM | POA: Diagnosis not present

## 2017-10-01 DIAGNOSIS — N183 Chronic kidney disease, stage 3 (moderate): Secondary | ICD-10-CM | POA: Diagnosis not present

## 2017-10-01 DIAGNOSIS — I10 Essential (primary) hypertension: Secondary | ICD-10-CM | POA: Diagnosis not present

## 2017-10-01 DIAGNOSIS — Z283 Underimmunization status: Secondary | ICD-10-CM | POA: Diagnosis not present

## 2017-10-01 DIAGNOSIS — E039 Hypothyroidism, unspecified: Secondary | ICD-10-CM | POA: Diagnosis not present

## 2017-10-01 DIAGNOSIS — M545 Low back pain: Secondary | ICD-10-CM | POA: Diagnosis not present

## 2017-11-05 DIAGNOSIS — H359 Unspecified retinal disorder: Secondary | ICD-10-CM | POA: Diagnosis not present

## 2017-12-07 DIAGNOSIS — H3554 Dystrophies primarily involving the retinal pigment epithelium: Secondary | ICD-10-CM | POA: Diagnosis not present

## 2018-03-16 DIAGNOSIS — E039 Hypothyroidism, unspecified: Secondary | ICD-10-CM | POA: Diagnosis not present

## 2018-03-16 DIAGNOSIS — E782 Mixed hyperlipidemia: Secondary | ICD-10-CM | POA: Diagnosis not present

## 2018-03-16 DIAGNOSIS — N183 Chronic kidney disease, stage 3 (moderate): Secondary | ICD-10-CM | POA: Diagnosis not present

## 2018-03-16 DIAGNOSIS — I10 Essential (primary) hypertension: Secondary | ICD-10-CM | POA: Diagnosis not present

## 2018-03-22 ENCOUNTER — Other Ambulatory Visit: Payer: Self-pay | Admitting: Physician Assistant

## 2018-03-22 DIAGNOSIS — Z1239 Encounter for other screening for malignant neoplasm of breast: Secondary | ICD-10-CM | POA: Diagnosis not present

## 2018-03-22 DIAGNOSIS — Z0000 Encounter for general adult medical examination without abnormal findings: Secondary | ICD-10-CM | POA: Diagnosis not present

## 2018-03-22 DIAGNOSIS — F419 Anxiety disorder, unspecified: Secondary | ICD-10-CM | POA: Diagnosis not present

## 2018-03-22 DIAGNOSIS — Z6833 Body mass index (BMI) 33.0-33.9, adult: Secondary | ICD-10-CM | POA: Diagnosis not present

## 2018-03-22 DIAGNOSIS — Z1231 Encounter for screening mammogram for malignant neoplasm of breast: Secondary | ICD-10-CM

## 2018-03-22 DIAGNOSIS — E039 Hypothyroidism, unspecified: Secondary | ICD-10-CM | POA: Diagnosis not present

## 2018-03-22 DIAGNOSIS — N183 Chronic kidney disease, stage 3 (moderate): Secondary | ICD-10-CM | POA: Diagnosis not present

## 2018-03-22 DIAGNOSIS — E782 Mixed hyperlipidemia: Secondary | ICD-10-CM | POA: Diagnosis not present

## 2018-03-22 DIAGNOSIS — I10 Essential (primary) hypertension: Secondary | ICD-10-CM | POA: Diagnosis not present

## 2018-03-22 DIAGNOSIS — M1711 Unilateral primary osteoarthritis, right knee: Secondary | ICD-10-CM | POA: Diagnosis not present

## 2018-06-04 DIAGNOSIS — Z85828 Personal history of other malignant neoplasm of skin: Secondary | ICD-10-CM | POA: Diagnosis not present

## 2018-06-04 DIAGNOSIS — L84 Corns and callosities: Secondary | ICD-10-CM | POA: Diagnosis not present

## 2018-06-04 DIAGNOSIS — D2261 Melanocytic nevi of right upper limb, including shoulder: Secondary | ICD-10-CM | POA: Diagnosis not present

## 2018-06-04 DIAGNOSIS — L718 Other rosacea: Secondary | ICD-10-CM | POA: Diagnosis not present

## 2018-06-04 DIAGNOSIS — D2262 Melanocytic nevi of left upper limb, including shoulder: Secondary | ICD-10-CM | POA: Diagnosis not present

## 2018-06-04 DIAGNOSIS — D225 Melanocytic nevi of trunk: Secondary | ICD-10-CM | POA: Diagnosis not present

## 2018-06-04 DIAGNOSIS — D2272 Melanocytic nevi of left lower limb, including hip: Secondary | ICD-10-CM | POA: Diagnosis not present

## 2018-06-04 DIAGNOSIS — Z08 Encounter for follow-up examination after completed treatment for malignant neoplasm: Secondary | ICD-10-CM | POA: Diagnosis not present

## 2018-06-04 DIAGNOSIS — D2271 Melanocytic nevi of right lower limb, including hip: Secondary | ICD-10-CM | POA: Diagnosis not present

## 2018-09-08 ENCOUNTER — Ambulatory Visit
Admission: RE | Admit: 2018-09-08 | Discharge: 2018-09-08 | Disposition: A | Discharge status: Home / Self Care | Payer: PPO | Source: Ambulatory Visit | Attending: Physician Assistant | Admitting: Physician Assistant

## 2018-09-08 DIAGNOSIS — Z1231 Encounter for screening mammogram for malignant neoplasm of breast: Secondary | ICD-10-CM | POA: Insufficient documentation

## 2018-09-20 DIAGNOSIS — N183 Chronic kidney disease, stage 3 (moderate): Secondary | ICD-10-CM | POA: Diagnosis not present

## 2018-09-20 DIAGNOSIS — I10 Essential (primary) hypertension: Secondary | ICD-10-CM | POA: Diagnosis not present

## 2018-09-20 DIAGNOSIS — E782 Mixed hyperlipidemia: Secondary | ICD-10-CM | POA: Diagnosis not present

## 2018-09-27 DIAGNOSIS — N183 Chronic kidney disease, stage 3 (moderate): Secondary | ICD-10-CM | POA: Diagnosis not present

## 2018-09-27 DIAGNOSIS — E039 Hypothyroidism, unspecified: Secondary | ICD-10-CM | POA: Diagnosis not present

## 2018-09-27 DIAGNOSIS — I10 Essential (primary) hypertension: Secondary | ICD-10-CM | POA: Diagnosis not present

## 2018-09-27 DIAGNOSIS — M1711 Unilateral primary osteoarthritis, right knee: Secondary | ICD-10-CM | POA: Diagnosis not present

## 2018-09-27 DIAGNOSIS — E782 Mixed hyperlipidemia: Secondary | ICD-10-CM | POA: Diagnosis not present

## 2018-11-02 DIAGNOSIS — N183 Chronic kidney disease, stage 3 unspecified: Secondary | ICD-10-CM | POA: Diagnosis not present

## 2018-11-15 DIAGNOSIS — F4321 Adjustment disorder with depressed mood: Secondary | ICD-10-CM | POA: Diagnosis not present

## 2018-11-15 DIAGNOSIS — I10 Essential (primary) hypertension: Secondary | ICD-10-CM | POA: Diagnosis not present

## 2018-12-20 DIAGNOSIS — F419 Anxiety disorder, unspecified: Secondary | ICD-10-CM | POA: Diagnosis not present

## 2019-04-14 DIAGNOSIS — R7309 Other abnormal glucose: Secondary | ICD-10-CM | POA: Diagnosis not present

## 2019-04-14 DIAGNOSIS — I10 Essential (primary) hypertension: Secondary | ICD-10-CM | POA: Diagnosis not present

## 2019-04-14 DIAGNOSIS — E039 Hypothyroidism, unspecified: Secondary | ICD-10-CM | POA: Diagnosis not present

## 2019-04-14 DIAGNOSIS — E782 Mixed hyperlipidemia: Secondary | ICD-10-CM | POA: Diagnosis not present

## 2019-04-14 DIAGNOSIS — N183 Chronic kidney disease, stage 3 unspecified: Secondary | ICD-10-CM | POA: Diagnosis not present

## 2019-04-26 DIAGNOSIS — I10 Essential (primary) hypertension: Secondary | ICD-10-CM | POA: Diagnosis not present

## 2019-04-26 DIAGNOSIS — E039 Hypothyroidism, unspecified: Secondary | ICD-10-CM | POA: Diagnosis not present

## 2019-04-26 DIAGNOSIS — R7303 Prediabetes: Secondary | ICD-10-CM | POA: Diagnosis not present

## 2019-04-26 DIAGNOSIS — Z0000 Encounter for general adult medical examination without abnormal findings: Secondary | ICD-10-CM | POA: Diagnosis not present

## 2019-04-26 DIAGNOSIS — F419 Anxiety disorder, unspecified: Secondary | ICD-10-CM | POA: Diagnosis not present

## 2019-04-26 DIAGNOSIS — E782 Mixed hyperlipidemia: Secondary | ICD-10-CM | POA: Diagnosis not present

## 2019-04-26 DIAGNOSIS — N1831 Chronic kidney disease, stage 3a: Secondary | ICD-10-CM | POA: Diagnosis not present

## 2019-06-17 DIAGNOSIS — D2262 Melanocytic nevi of left upper limb, including shoulder: Secondary | ICD-10-CM | POA: Diagnosis not present

## 2019-06-17 DIAGNOSIS — D2261 Melanocytic nevi of right upper limb, including shoulder: Secondary | ICD-10-CM | POA: Diagnosis not present

## 2019-06-17 DIAGNOSIS — D225 Melanocytic nevi of trunk: Secondary | ICD-10-CM | POA: Diagnosis not present

## 2019-06-17 DIAGNOSIS — Z85828 Personal history of other malignant neoplasm of skin: Secondary | ICD-10-CM | POA: Diagnosis not present

## 2019-06-17 DIAGNOSIS — L718 Other rosacea: Secondary | ICD-10-CM | POA: Diagnosis not present

## 2019-09-06 DIAGNOSIS — H353131 Nonexudative age-related macular degeneration, bilateral, early dry stage: Secondary | ICD-10-CM | POA: Diagnosis not present

## 2019-10-21 DIAGNOSIS — R7303 Prediabetes: Secondary | ICD-10-CM | POA: Diagnosis not present

## 2019-10-21 DIAGNOSIS — E039 Hypothyroidism, unspecified: Secondary | ICD-10-CM | POA: Diagnosis not present

## 2019-10-21 DIAGNOSIS — E782 Mixed hyperlipidemia: Secondary | ICD-10-CM | POA: Diagnosis not present

## 2019-10-21 DIAGNOSIS — I10 Essential (primary) hypertension: Secondary | ICD-10-CM | POA: Diagnosis not present

## 2019-10-21 DIAGNOSIS — N1831 Chronic kidney disease, stage 3a: Secondary | ICD-10-CM | POA: Diagnosis not present

## 2019-10-24 ENCOUNTER — Other Ambulatory Visit: Payer: Self-pay | Admitting: Physician Assistant

## 2019-10-24 DIAGNOSIS — Z1231 Encounter for screening mammogram for malignant neoplasm of breast: Secondary | ICD-10-CM

## 2019-10-26 DIAGNOSIS — Z1211 Encounter for screening for malignant neoplasm of colon: Secondary | ICD-10-CM | POA: Diagnosis not present

## 2019-10-26 DIAGNOSIS — N1831 Chronic kidney disease, stage 3a: Secondary | ICD-10-CM | POA: Diagnosis not present

## 2019-10-26 DIAGNOSIS — I10 Essential (primary) hypertension: Secondary | ICD-10-CM | POA: Diagnosis not present

## 2019-10-26 DIAGNOSIS — R7303 Prediabetes: Secondary | ICD-10-CM | POA: Diagnosis not present

## 2019-10-26 DIAGNOSIS — F4321 Adjustment disorder with depressed mood: Secondary | ICD-10-CM | POA: Diagnosis not present

## 2019-10-26 DIAGNOSIS — Z1231 Encounter for screening mammogram for malignant neoplasm of breast: Secondary | ICD-10-CM | POA: Diagnosis not present

## 2019-10-26 DIAGNOSIS — E782 Mixed hyperlipidemia: Secondary | ICD-10-CM | POA: Diagnosis not present

## 2019-10-26 DIAGNOSIS — E039 Hypothyroidism, unspecified: Secondary | ICD-10-CM | POA: Diagnosis not present

## 2019-10-27 ENCOUNTER — Ambulatory Visit
Admission: RE | Admit: 2019-10-27 | Discharge: 2019-10-27 | Disposition: A | Discharge status: Home / Self Care | Payer: PPO | Source: Ambulatory Visit | Attending: Physician Assistant | Admitting: Physician Assistant

## 2019-10-27 ENCOUNTER — Other Ambulatory Visit: Payer: Self-pay

## 2019-10-27 DIAGNOSIS — Z1231 Encounter for screening mammogram for malignant neoplasm of breast: Secondary | ICD-10-CM | POA: Diagnosis not present

## 2020-07-06 DIAGNOSIS — I10 Essential (primary) hypertension: Secondary | ICD-10-CM | POA: Diagnosis not present

## 2020-07-06 DIAGNOSIS — N1831 Chronic kidney disease, stage 3a: Secondary | ICD-10-CM | POA: Diagnosis not present

## 2020-07-06 DIAGNOSIS — E782 Mixed hyperlipidemia: Secondary | ICD-10-CM | POA: Diagnosis not present

## 2020-07-06 DIAGNOSIS — R7303 Prediabetes: Secondary | ICD-10-CM | POA: Diagnosis not present

## 2020-07-06 DIAGNOSIS — E039 Hypothyroidism, unspecified: Secondary | ICD-10-CM | POA: Diagnosis not present

## 2020-07-17 DIAGNOSIS — Z0000 Encounter for general adult medical examination without abnormal findings: Secondary | ICD-10-CM | POA: Diagnosis not present

## 2020-07-17 DIAGNOSIS — E782 Mixed hyperlipidemia: Secondary | ICD-10-CM | POA: Diagnosis not present

## 2020-07-17 DIAGNOSIS — E039 Hypothyroidism, unspecified: Secondary | ICD-10-CM | POA: Diagnosis not present

## 2020-07-17 DIAGNOSIS — N1831 Chronic kidney disease, stage 3a: Secondary | ICD-10-CM | POA: Diagnosis not present

## 2020-07-17 DIAGNOSIS — F4321 Adjustment disorder with depressed mood: Secondary | ICD-10-CM | POA: Diagnosis not present

## 2020-07-17 DIAGNOSIS — Z1211 Encounter for screening for malignant neoplasm of colon: Secondary | ICD-10-CM | POA: Diagnosis not present

## 2020-07-17 DIAGNOSIS — Z780 Asymptomatic menopausal state: Secondary | ICD-10-CM | POA: Diagnosis not present

## 2020-07-17 DIAGNOSIS — I10 Essential (primary) hypertension: Secondary | ICD-10-CM | POA: Diagnosis not present

## 2020-08-01 DIAGNOSIS — Z780 Asymptomatic menopausal state: Secondary | ICD-10-CM | POA: Diagnosis not present

## 2020-11-16 ENCOUNTER — Other Ambulatory Visit: Payer: Self-pay | Admitting: Physician Assistant

## 2020-11-16 DIAGNOSIS — Z1231 Encounter for screening mammogram for malignant neoplasm of breast: Secondary | ICD-10-CM

## 2021-01-29 ENCOUNTER — Encounter: Payer: Self-pay | Admitting: Internal Medicine

## 2021-01-30 ENCOUNTER — Encounter
Admission: RE | Disposition: A | Discharge status: Home / Self Care | Payer: Self-pay | Source: Home / Self Care | Attending: Internal Medicine

## 2021-01-30 ENCOUNTER — Ambulatory Visit: Admit: 2021-01-30 | Payer: BLUE CROSS/BLUE SHIELD | Admitting: Gastroenterology

## 2021-01-30 ENCOUNTER — Ambulatory Visit: Payer: PPO | Admitting: Anesthesiology

## 2021-01-30 ENCOUNTER — Ambulatory Visit
Admission: RE | Admit: 2021-01-30 | Discharge: 2021-01-30 | Disposition: A | Discharge status: Home / Self Care | Payer: PPO | Attending: Internal Medicine | Admitting: Internal Medicine

## 2021-01-30 ENCOUNTER — Encounter: Payer: Self-pay | Admitting: Internal Medicine

## 2021-01-30 DIAGNOSIS — I10 Essential (primary) hypertension: Secondary | ICD-10-CM | POA: Diagnosis not present

## 2021-01-30 DIAGNOSIS — E039 Hypothyroidism, unspecified: Secondary | ICD-10-CM | POA: Insufficient documentation

## 2021-01-30 DIAGNOSIS — Z1211 Encounter for screening for malignant neoplasm of colon: Secondary | ICD-10-CM | POA: Insufficient documentation

## 2021-01-30 DIAGNOSIS — K5730 Diverticulosis of large intestine without perforation or abscess without bleeding: Secondary | ICD-10-CM | POA: Insufficient documentation

## 2021-01-30 DIAGNOSIS — K648 Other hemorrhoids: Secondary | ICD-10-CM | POA: Diagnosis not present

## 2021-01-30 DIAGNOSIS — E785 Hyperlipidemia, unspecified: Secondary | ICD-10-CM | POA: Insufficient documentation

## 2021-01-30 DIAGNOSIS — K640 First degree hemorrhoids: Secondary | ICD-10-CM | POA: Diagnosis not present

## 2021-01-30 HISTORY — PX: COLONOSCOPY WITH PROPOFOL: SHX5780

## 2021-01-30 HISTORY — DX: Irritable bowel syndrome, unspecified: K58.9

## 2021-01-30 HISTORY — DX: Hyperlipidemia, unspecified: E78.5

## 2021-01-30 HISTORY — DX: Hypothyroidism, unspecified: E03.9

## 2021-01-30 HISTORY — DX: Essential (primary) hypertension: I10

## 2021-01-30 HISTORY — DX: Anxiety disorder, unspecified: F41.9

## 2021-01-30 HISTORY — DX: Unspecified osteoarthritis, unspecified site: M19.90

## 2021-01-30 HISTORY — DX: Headache, unspecified: R51.9

## 2021-01-30 SURGERY — COLONOSCOPY WITH PROPOFOL
Anesthesia: General

## 2021-01-30 MED ORDER — PROPOFOL 10 MG/ML IV BOLUS
INTRAVENOUS | Status: DC | PRN
  Administered 2021-01-30: 50 mg via INTRAVENOUS

## 2021-01-30 MED ORDER — SODIUM CHLORIDE 0.9 % IV SOLN: INTRAVENOUS | Status: DC

## 2021-01-30 MED ORDER — LIDOCAINE HCL (CARDIAC) PF 100 MG/5ML IV SOSY
PREFILLED_SYRINGE | INTRAVENOUS | Status: DC | PRN
  Administered 2021-01-30: 50 mg via INTRAVENOUS

## 2021-01-30 MED ORDER — PROPOFOL 500 MG/50ML IV EMUL
INTRAVENOUS | Status: DC | PRN
  Administered 2021-01-30: 140 ug/kg/min via INTRAVENOUS

## 2021-01-30 MED ORDER — LIDOCAINE HCL (PF) 1 % IJ SOLN
INTRAMUSCULAR | Status: AC
  Filled 2021-01-30: qty 2

## 2021-01-30 MED ORDER — PROPOFOL 10 MG/ML IV BOLUS
INTRAVENOUS | Status: AC
  Filled 2021-01-30: qty 20

## 2021-01-30 NOTE — Interval H&P Note (Signed)
History and Physical Interval Note:  01/30/2021 11:33 AM  Sonya Gomez  has presented today for surgery, with the diagnosis of Screening.  The various methods of treatment have been discussed with the patient and family. After consideration of risks, benefits and other options for treatment, the patient has consented to  Procedure(s): COLONOSCOPY WITH PROPOFOL (N/A) as a surgical intervention.  The patient's history has been reviewed, patient examined, no change in status, stable for surgery.  I have reviewed the patient's chart and labs.  Questions were answered to the patient's satisfaction.     [place], [place]

## 2021-01-30 NOTE — H&P (Signed)
Outpatient short stay form Pre-procedure 01/30/2021 11:32 AM Sonya Gomez K. Alice Reichert, M.D.  Primary Physician: Paulita Cradle, M.D.  Reason for visit:  Colon cancer screening  History of present illness:  Patient presents for colonoscopy for colon cancer screening. The patient denies complaints of abdominal pain, significant change in bowel habits, or rectal bleeding.      Current Facility-Administered Medications:    0.9 %  sodium chloride infusion, , Intravenous, Continuous, [place], Benay Pike, MD, Last Rate: 20 mL/hr at 01/30/21 1128, Continued from Pre-op at 01/30/21 1128   lidocaine (PF) (XYLOCAINE) 1 % injection, , , ,   Medications Prior to Admission  Medication Sig Dispense Refill Last Dose   ALPRAZolam (XANAX) 0.5 MG tablet Take 0.5 mg by mouth 2 (two) times daily as needed for anxiety or sleep.   Past Month   atorvastatin (LIPITOR) 20 MG tablet Take 20 mg by mouth daily.   01/29/2021   buPROPion (WELLBUTRIN XL) 300 MG 24 hr tablet Take 300 mg by mouth daily.   01/29/2021   Cholecalciferol (VITAMIN D3) 1.25 MG (50000 UT) CAPS Take by mouth.      estrogen, conjugated,-medroxyprogesterone (PREMPRO) 0.625-2.5 MG tablet Take 1 tablet by mouth daily.      HYDROcodone-acetaminophen (NORCO/VICODIN) 5-325 MG tablet Take 1 tablet by mouth every 6 (six) hours as needed for moderate pain.      ibuprofen (ADVIL) 800 MG tablet Take 800 mg by mouth every 4 (four) hours as needed.      levothyroxine (SYNTHROID) 75 MCG tablet Take 75 mcg by mouth daily before breakfast.   01/28/2021   lisinopril (ZESTRIL) 10 MG tablet Take 10 mg by mouth daily.   01/30/2021   methscopolamine (PAMINE FORTE) 5 MG tablet Take 5 mg by mouth as needed.      Multiple Vitamins-Minerals (ZINC PO) Take by mouth daily at 8 pm.      pantoprazole (PROTONIX) 40 MG tablet Take 40 mg by mouth 2 (two) times daily.   01/29/2021   triamterene-hydrochlorothiazide (MAXZIDE-25) 37.5-25 MG tablet Take 1 tablet by mouth daily.   01/29/2021    celecoxib (CELEBREX) 200 MG capsule Take 200 mg by mouth 2 (two) times daily. (Patient not taking: Reported on 01/30/2021)   Not Taking   escitalopram (LEXAPRO) 10 MG tablet Take 10 mg by mouth daily. (Patient not taking: Reported on 01/30/2021)   Not Taking     Allergies  Allergen Reactions   Ceclor [Cefaclor] Hives   Crestor [Rosuvastatin] Other (See Comments)    LEG CRAMPS   Oxycodone Nausea Only   Toradol [Ketorolac Tromethamine] Other (See Comments)    COLITIS     Past Medical History:  Diagnosis Date   Anxiety    Arthritis    OSTEOARTHRITIS RT KNEE   Cancer ([place])    Skin   Headache    MIGRAINES   HLD (hyperlipidemia)    Hypertension    Hypothyroidism    IBS (irritable bowel syndrome)     Review of systems:  Otherwise negative.    Physical Exam  Gen: Alert, oriented. Appears stated age.  HEENT: [place]/AT. PERRLA. Lungs: CTA, no wheezes. CV: RR nl S1, S2. Abd: soft, benign, no masses. BS+ Ext: No edema. Pulses 2+    Planned procedures: Proceed with colonoscopy. The patient understands the nature of the planned procedure, indications, risks, alternatives and potential complications including but not limited to bleeding, infection, perforation, damage to internal organs and possible oversedation/side effects from anesthesia. The patient agrees and gives consent  to proceed.  Please refer to procedure notes for findings, recommendations and patient disposition/instructions.     Sonya Gomez K. Alice Reichert, M.D. Gastroenterology 01/30/2021  11:32 AM

## 2021-01-30 NOTE — Anesthesia Postprocedure Evaluation (Signed)
Anesthesia Post Note  Patient: Sonya Gomez  Procedure(s) Performed: COLONOSCOPY WITH PROPOFOL  Patient location during evaluation: Endoscopy Anesthesia Type: General Level of consciousness: awake and alert Pain management: pain level controlled Vital Signs Assessment: post-procedure vital signs reviewed and stable Respiratory status: spontaneous breathing, nonlabored ventilation, respiratory function stable and patient connected to nasal cannula oxygen Cardiovascular status: blood pressure returned to baseline and stable Postop Assessment: no apparent nausea or vomiting Anesthetic complications: no   No notable events documented.   Last Vitals:  Vitals:   01/30/21 1210 01/30/21 1213  BP: 120/85   Pulse: 72 67  Resp: 12 10  Temp:    SpO2: 100% 100%    Last Pain:  Vitals:   01/30/21 1158  TempSrc: Temporal                 Precious Haws Charleene Callegari

## 2021-01-30 NOTE — Op Note (Signed)
[facility] Gastroenterology Patient Name: Sonya Gomez Procedure Date: 01/30/2021 11:30 AM MRN: [id number] Account #: [id number] Date of Birth: 29-Jul-1950 Admit Type: Outpatient Age: 71 Room: [facility] ENDO ROOM 2 Gender: Female Note Status: Finalized Instrument Name: Jasper Riling [id number] Procedure:             Colonoscopy Indications:           Screening for colorectal malignant neoplasm Providers:             Lorie Apley K. Alice Reichert MD, MD Referring MD:          Precious Bard, MD (Referring MD) Medicines:             Propofol per Anesthesia Complications:         No immediate complications. Procedure:             Pre-Anesthesia Assessment:                        - The risks and benefits of the procedure and the                         sedation options and risks were discussed with the                         patient. All questions were answered and informed                         consent was obtained.                        - Patient identification and proposed procedure were                         verified prior to the procedure by the nurse. The                         procedure was verified in the procedure room.                        - ASA Grade Assessment: III - A patient with severe                         systemic disease.                        - After reviewing the risks and benefits, the patient                         was deemed in satisfactory condition to undergo the                         procedure.                        After obtaining informed consent, the colonoscope was                         passed under direct vision. Throughout the procedure,  the patient's blood pressure, pulse, and oxygen                         saturations were monitored continuously. The                         Colonoscope was introduced through the anus and                         advanced to the the cecum, identified by appendiceal                          orifice and ileocecal valve. The colonoscopy was                         performed without difficulty. The patient tolerated                         the procedure well. The quality of the bowel                         preparation was good. The ileocecal valve, appendiceal                         orifice, and rectum were photographed. Findings:      The perianal and digital rectal examinations were normal. Pertinent       negatives include normal sphincter tone and no palpable rectal lesions.      Non-bleeding internal hemorrhoids were found during retroflexion. The       hemorrhoids were Grade I (internal hemorrhoids that do not prolapse).      Multiple small and large-mouthed diverticula were found in the sigmoid       colon.      A single small-mouthed diverticulum was found in the ascending colon.      The exam was otherwise without abnormality. Impression:            - Non-bleeding internal hemorrhoids.                        - Diverticulosis in the sigmoid colon.                        - Diverticulosis in the ascending colon.                        - The examination was otherwise normal.                        - No specimens collected. Recommendation:        - Patient has a contact number available for                         emergencies. The signs and symptoms of potential                         delayed complications were discussed with the patient.                         Return to normal activities tomorrow. Written  discharge instructions were provided to the patient.                        - Resume previous diet.                        - Continue present medications.                        - No repeat colonoscopy due to current age (59 years                         or older) and the absence of colonic polyps.                        - You do NOT require further colon cancer screening                         measures (Annual stool testing (i.e.  hemoccult, FIT,                         cologuard), sigmoidoscopy, colonoscopy or CT                         colonography). You should share this recommendation                         with your Primary Care provider.                        - Return to GI office PRN.                        - The findings and recommendations were discussed with                         the patient. Procedure Code(s):     --- Professional ---                        P8099, Colorectal cancer screening; colonoscopy on                         individual not meeting criteria for high risk Diagnosis Code(s):     --- Professional ---                        K57.30, Diverticulosis of large intestine without                         perforation or abscess without bleeding                        K64.0, First degree hemorrhoids                        Z12.11, Encounter for screening for malignant neoplasm                         of colon CPT copyright 2019 American Medical Association. All rights reserved. The codes documented in this report are preliminary  and upon coder review may  be revised to meet current compliance requirements. Efrain Sella MD, MD 01/30/2021 11:59:31 AM This report has been signed electronically. Number of Addenda: 0 Note Initiated On: 01/30/2021 11:30 AM Scope Withdrawal Time: 0 hours 5 minutes 1 second  Total Procedure Duration: 0 hours 7 minutes 57 seconds  Estimated Blood Loss:  Estimated blood loss: none. Estimated blood loss: none.      [facility]

## 2021-01-30 NOTE — Transfer of Care (Signed)
Immediate Anesthesia Transfer of Care Note  Patient: Sonya Gomez  Procedure(s) Performed: COLONOSCOPY WITH PROPOFOL  Patient Location: Endoscopy Unit  Anesthesia Type:General  Level of Consciousness: awake, alert  and oriented  Airway & Oxygen Therapy: Patient Spontanous Breathing  Post-op Assessment: Report given to RN and Post -op Vital signs reviewed and stable  Post vital signs: Reviewed and stable  Last Vitals:  Vitals Value Taken Time  BP 110/71 01/30/21 1159  Temp    Pulse 86 01/30/21 1200  Resp 17 01/30/21 1200  SpO2 100 % 01/30/21 1200  Vitals shown include unvalidated device data.  Last Pain:  Vitals:   01/30/21 1102  TempSrc: Temporal         Complications: No notable events documented.

## 2021-01-30 NOTE — Anesthesia Procedure Notes (Signed)
Date/Time: 01/30/2021 11:46 AM Performed by: Lily Peer, Kassim Guertin, CRNA Pre-anesthesia Checklist: Patient identified, Emergency Drugs available, Suction available, Patient being monitored and Timeout performed Patient Re-evaluated:Patient Re-evaluated prior to induction Oxygen Delivery Method: Nasal cannula Induction Type: IV induction

## 2021-01-30 NOTE — Anesthesia Preprocedure Evaluation (Signed)
Anesthesia Evaluation  Patient identified by MRN, date of birth, ID band Patient awake    Reviewed: Allergy & Precautions, NPO status , Patient's Chart, lab work & pertinent test results  Airway Mallampati: III  TM Distance: >3 FB Neck ROM: full    Dental  (+) Chipped   Pulmonary neg pulmonary ROS, neg shortness of breath,    Pulmonary exam normal        Cardiovascular Exercise Tolerance: Good hypertension, Normal cardiovascular exam     Neuro/Psych  Headaches, negative psych ROS   GI/Hepatic negative GI ROS, Neg liver ROS, neg GERD  ,  Endo/Other  negative endocrine ROSHypothyroidism   Renal/GU negative Renal ROS  negative genitourinary   Musculoskeletal  (+) Arthritis ,   Abdominal   Peds  Hematology negative hematology ROS (+)   Anesthesia Other Findings Past Medical History: No date: Anxiety No date: Arthritis     Comment:  OSTEOARTHRITIS RT KNEE No date: Cancer ([place])     Comment:  Skin No date: Headache     Comment:  MIGRAINES No date: HLD (hyperlipidemia) No date: Hypertension No date: Hypothyroidism No date: IBS (irritable bowel syndrome)  Past Surgical History: No date: BREAST BIOPSY; Left     Comment:  Negative No date: BREAST CYST ASPIRATION; Bilateral     Comment:  Negative No date: FOOT SURGERY; Left     Comment:  pins No date: JOINT REPLACEMENT; Bilateral     Comment:  KNEE REPLACEMENT     Reproductive/Obstetrics negative OB ROS                             Anesthesia Physical Anesthesia Plan  ASA: 3  Anesthesia Plan: General   Post-op Pain Management:    Induction: Intravenous  PONV Risk Score and Plan: Propofol infusion and TIVA  Airway Management Planned: Natural Airway and Nasal Cannula  Additional Equipment:   Intra-op Plan:   Post-operative Plan:   Informed Consent: I have reviewed the patients History and Physical, chart, labs and  discussed the procedure including the risks, benefits and alternatives for the proposed anesthesia with the patient or authorized representative who has indicated his/her understanding and acceptance.     Dental Advisory Given  Plan Discussed with: Anesthesiologist, CRNA and Surgeon  Anesthesia Plan Comments: (Patient consented for risks of anesthesia including but not limited to:  - adverse reactions to medications - risk of airway placement if required - damage to eyes, teeth, lips or other oral mucosa - nerve damage due to positioning  - sore throat or hoarseness - Damage to heart, brain, nerves, lungs, other parts of body or loss of life  Patient voiced understanding.)        Anesthesia Quick Evaluation

## 2021-01-31 ENCOUNTER — Encounter: Payer: Self-pay | Admitting: Internal Medicine

## 2021-02-05 DIAGNOSIS — N1831 Chronic kidney disease, stage 3a: Secondary | ICD-10-CM | POA: Diagnosis not present

## 2021-02-05 DIAGNOSIS — E039 Hypothyroidism, unspecified: Secondary | ICD-10-CM | POA: Diagnosis not present

## 2021-02-05 DIAGNOSIS — R7303 Prediabetes: Secondary | ICD-10-CM | POA: Diagnosis not present

## 2021-02-05 DIAGNOSIS — Z1231 Encounter for screening mammogram for malignant neoplasm of breast: Secondary | ICD-10-CM | POA: Diagnosis not present

## 2021-02-05 DIAGNOSIS — E782 Mixed hyperlipidemia: Secondary | ICD-10-CM | POA: Diagnosis not present

## 2021-02-05 DIAGNOSIS — I10 Essential (primary) hypertension: Secondary | ICD-10-CM | POA: Diagnosis not present

## 2021-03-18 ENCOUNTER — Ambulatory Visit
Admission: RE | Admit: 2021-03-18 | Discharge: 2021-03-18 | Disposition: A | Discharge status: Home / Self Care | Payer: PPO | Source: Ambulatory Visit | Attending: Physician Assistant | Admitting: Physician Assistant

## 2021-03-18 ENCOUNTER — Other Ambulatory Visit: Payer: Self-pay

## 2021-03-18 DIAGNOSIS — Z1231 Encounter for screening mammogram for malignant neoplasm of breast: Secondary | ICD-10-CM | POA: Diagnosis not present

## 2021-07-22 DIAGNOSIS — Z23 Encounter for immunization: Secondary | ICD-10-CM | POA: Diagnosis not present

## 2021-07-22 DIAGNOSIS — Z780 Asymptomatic menopausal state: Secondary | ICD-10-CM | POA: Diagnosis not present

## 2021-07-22 DIAGNOSIS — N1831 Chronic kidney disease, stage 3a: Secondary | ICD-10-CM | POA: Diagnosis not present

## 2021-07-22 DIAGNOSIS — E039 Hypothyroidism, unspecified: Secondary | ICD-10-CM | POA: Diagnosis not present

## 2021-07-22 DIAGNOSIS — Z0000 Encounter for general adult medical examination without abnormal findings: Secondary | ICD-10-CM | POA: Diagnosis not present

## 2021-07-22 DIAGNOSIS — I10 Essential (primary) hypertension: Secondary | ICD-10-CM | POA: Diagnosis not present

## 2021-07-22 DIAGNOSIS — E782 Mixed hyperlipidemia: Secondary | ICD-10-CM | POA: Diagnosis not present

## 2021-07-22 DIAGNOSIS — R7303 Prediabetes: Secondary | ICD-10-CM | POA: Diagnosis not present

## 2022-03-13 ENCOUNTER — Other Ambulatory Visit: Payer: Self-pay | Admitting: Physician Assistant

## 2022-03-13 DIAGNOSIS — Z79891 Long term (current) use of opiate analgesic: Secondary | ICD-10-CM | POA: Diagnosis not present

## 2022-03-13 DIAGNOSIS — E039 Hypothyroidism, unspecified: Secondary | ICD-10-CM | POA: Diagnosis not present

## 2022-03-13 DIAGNOSIS — E782 Mixed hyperlipidemia: Secondary | ICD-10-CM | POA: Diagnosis not present

## 2022-03-13 DIAGNOSIS — Z0000 Encounter for general adult medical examination without abnormal findings: Secondary | ICD-10-CM | POA: Diagnosis not present

## 2022-03-13 DIAGNOSIS — N1831 Chronic kidney disease, stage 3a: Secondary | ICD-10-CM | POA: Diagnosis not present

## 2022-03-13 DIAGNOSIS — I10 Essential (primary) hypertension: Secondary | ICD-10-CM | POA: Diagnosis not present

## 2022-03-13 DIAGNOSIS — Z1231 Encounter for screening mammogram for malignant neoplasm of breast: Secondary | ICD-10-CM | POA: Diagnosis not present

## 2022-03-13 DIAGNOSIS — R7303 Prediabetes: Secondary | ICD-10-CM | POA: Diagnosis not present

## 2022-03-20 DIAGNOSIS — I10 Essential (primary) hypertension: Secondary | ICD-10-CM | POA: Diagnosis not present

## 2022-03-20 DIAGNOSIS — R7303 Prediabetes: Secondary | ICD-10-CM | POA: Diagnosis not present

## 2022-03-20 DIAGNOSIS — E782 Mixed hyperlipidemia: Secondary | ICD-10-CM | POA: Diagnosis not present

## 2022-03-20 DIAGNOSIS — E039 Hypothyroidism, unspecified: Secondary | ICD-10-CM | POA: Diagnosis not present

## 2022-03-20 DIAGNOSIS — Z79891 Long term (current) use of opiate analgesic: Secondary | ICD-10-CM | POA: Diagnosis not present

## 2022-05-16 ENCOUNTER — Ambulatory Visit
Admission: RE | Admit: 2022-05-16 | Discharge: 2022-05-16 | Disposition: A | Discharge status: Home / Self Care | Payer: PPO | Source: Ambulatory Visit | Attending: Physician Assistant | Admitting: Physician Assistant

## 2022-05-16 DIAGNOSIS — Z1231 Encounter for screening mammogram for malignant neoplasm of breast: Secondary | ICD-10-CM | POA: Insufficient documentation

## 2022-09-16 DIAGNOSIS — Z0000 Encounter for general adult medical examination without abnormal findings: Secondary | ICD-10-CM | POA: Diagnosis not present

## 2022-09-16 DIAGNOSIS — N1831 Chronic kidney disease, stage 3a: Secondary | ICD-10-CM | POA: Diagnosis not present

## 2022-09-16 DIAGNOSIS — E039 Hypothyroidism, unspecified: Secondary | ICD-10-CM | POA: Diagnosis not present

## 2022-09-16 DIAGNOSIS — R7303 Prediabetes: Secondary | ICD-10-CM | POA: Diagnosis not present

## 2022-09-16 DIAGNOSIS — I10 Essential (primary) hypertension: Secondary | ICD-10-CM | POA: Diagnosis not present

## 2022-09-16 DIAGNOSIS — E782 Mixed hyperlipidemia: Secondary | ICD-10-CM | POA: Diagnosis not present

## 2023-04-02 DIAGNOSIS — I10 Essential (primary) hypertension: Secondary | ICD-10-CM | POA: Diagnosis not present

## 2023-04-02 DIAGNOSIS — R7303 Prediabetes: Secondary | ICD-10-CM | POA: Diagnosis not present

## 2023-04-02 DIAGNOSIS — E782 Mixed hyperlipidemia: Secondary | ICD-10-CM | POA: Diagnosis not present

## 2023-04-02 DIAGNOSIS — E039 Hypothyroidism, unspecified: Secondary | ICD-10-CM | POA: Diagnosis not present

## 2023-04-07 DIAGNOSIS — N1831 Chronic kidney disease, stage 3a: Secondary | ICD-10-CM | POA: Diagnosis not present

## 2023-04-07 DIAGNOSIS — M25542 Pain in joints of left hand: Secondary | ICD-10-CM | POA: Diagnosis not present

## 2023-04-07 DIAGNOSIS — Z1231 Encounter for screening mammogram for malignant neoplasm of breast: Secondary | ICD-10-CM | POA: Diagnosis not present

## 2023-04-07 DIAGNOSIS — Z0000 Encounter for general adult medical examination without abnormal findings: Secondary | ICD-10-CM | POA: Diagnosis not present

## 2023-04-07 DIAGNOSIS — Z79891 Long term (current) use of opiate analgesic: Secondary | ICD-10-CM | POA: Diagnosis not present

## 2023-04-07 DIAGNOSIS — R300 Dysuria: Secondary | ICD-10-CM | POA: Diagnosis not present

## 2023-04-07 DIAGNOSIS — M25541 Pain in joints of right hand: Secondary | ICD-10-CM | POA: Diagnosis not present

## 2023-04-07 DIAGNOSIS — N1832 Chronic kidney disease, stage 3b: Secondary | ICD-10-CM | POA: Diagnosis not present

## 2023-04-07 DIAGNOSIS — R7303 Prediabetes: Secondary | ICD-10-CM | POA: Diagnosis not present

## 2023-04-07 DIAGNOSIS — I10 Essential (primary) hypertension: Secondary | ICD-10-CM | POA: Diagnosis not present

## 2023-04-07 DIAGNOSIS — E039 Hypothyroidism, unspecified: Secondary | ICD-10-CM | POA: Diagnosis not present

## 2023-04-07 DIAGNOSIS — E782 Mixed hyperlipidemia: Secondary | ICD-10-CM | POA: Diagnosis not present

## 2023-04-07 DIAGNOSIS — D649 Anemia, unspecified: Secondary | ICD-10-CM | POA: Diagnosis not present

## 2023-07-01 DIAGNOSIS — J0191 Acute recurrent sinusitis, unspecified: Secondary | ICD-10-CM | POA: Diagnosis not present

## 2023-07-21 ENCOUNTER — Other Ambulatory Visit: Payer: Self-pay | Admitting: Physician Assistant

## 2023-07-21 DIAGNOSIS — Z1231 Encounter for screening mammogram for malignant neoplasm of breast: Secondary | ICD-10-CM

## 2023-08-06 ENCOUNTER — Ambulatory Visit
Admission: RE | Admit: 2023-08-06 | Discharge: 2023-08-06 | Disposition: A | Discharge status: Home / Self Care | Source: Ambulatory Visit | Attending: Physician Assistant | Admitting: Physician Assistant

## 2023-08-06 DIAGNOSIS — Z1231 Encounter for screening mammogram for malignant neoplasm of breast: Secondary | ICD-10-CM | POA: Diagnosis not present

## 2023-08-07 ENCOUNTER — Other Ambulatory Visit: Payer: Self-pay

## 2023-08-07 ENCOUNTER — Emergency Department
Admission: EM | Admit: 2023-08-07 | Discharge: 2023-08-07 | Disposition: A | Discharge status: Home / Self Care | Source: Ambulatory Visit | Attending: Emergency Medicine | Admitting: Emergency Medicine

## 2023-08-07 DIAGNOSIS — E039 Hypothyroidism, unspecified: Secondary | ICD-10-CM | POA: Diagnosis not present

## 2023-08-07 DIAGNOSIS — I10 Essential (primary) hypertension: Secondary | ICD-10-CM | POA: Insufficient documentation

## 2023-08-07 DIAGNOSIS — R002 Palpitations: Secondary | ICD-10-CM | POA: Insufficient documentation

## 2023-08-07 LAB — CBC WITH DIFFERENTIAL/PLATELET
Abs Immature Granulocytes: 0.04 K/uL (ref 0.00–0.07)
Basophils Absolute: 0.1 K/uL (ref 0.0–0.1)
Basophils Relative: 1 %
Eosinophils Absolute: 0.3 K/uL (ref 0.0–0.5)
Eosinophils Relative: 4 %
HCT: 39.7 % (ref 36.0–46.0)
Hemoglobin: 12.8 g/dL (ref 12.0–15.0)
Immature Granulocytes: 1 %
Lymphocytes Relative: 22 %
Lymphs Abs: 1.9 K/uL (ref 0.7–4.0)
MCH: 29.9 pg (ref 26.0–34.0)
MCHC: 32.2 g/dL (ref 30.0–36.0)
MCV: 92.8 fL (ref 80.0–100.0)
Monocytes Absolute: 0.7 K/uL (ref 0.1–1.0)
Monocytes Relative: 8 %
Neutro Abs: 5.7 K/uL (ref 1.7–7.7)
Neutrophils Relative %: 64 %
Platelets: 378 K/uL (ref 150–400)
RBC: 4.28 MIL/uL (ref 3.87–5.11)
RDW: 12.6 % (ref 11.5–15.5)
WBC: 8.7 K/uL (ref 4.0–10.5)
nRBC: 0 % (ref 0.0–0.2)

## 2023-08-07 LAB — COMPREHENSIVE METABOLIC PANEL WITH GFR
ALT: 16 U/L (ref 0–44)
AST: 18 U/L (ref 15–41)
Albumin: 3.8 g/dL (ref 3.5–5.0)
Alkaline Phosphatase: 58 U/L (ref 38–126)
Anion gap: 12 (ref 5–15)
BUN: 26 mg/dL — ABNORMAL HIGH (ref 8–23)
CO2: 22 mmol/L (ref 22–32)
Calcium: 9.6 mg/dL (ref 8.9–10.3)
Chloride: 103 mmol/L (ref 98–111)
Creatinine, Ser: 1.38 mg/dL — ABNORMAL HIGH (ref 0.44–1.00)
GFR, Estimated: 41 mL/min — ABNORMAL LOW (ref >60)
Glucose, Bld: 95 mg/dL (ref 70–99)
Potassium: 4.6 mmol/L (ref 3.5–5.1)
Sodium: 137 mmol/L (ref 135–145)
Total Bilirubin: 0.4 mg/dL (ref 0.0–1.2)
Total Protein: 7 g/dL (ref 6.5–8.1)

## 2023-08-07 LAB — TROPONIN I (HIGH SENSITIVITY): Troponin I (High Sensitivity): 7 ng/L (ref <18)

## 2023-08-07 NOTE — ED Notes (Signed)
 See triage note.  Presents from [facility]  states she had an episode of rapid heart rate/palpations

## 2023-08-07 NOTE — ED Triage Notes (Signed)
 Brought from [facility]. C/o rapid heart rate. History of rapid heart rate   KC vitals: 166/98 b/p 99 HR 100% RA 98.3 oral

## 2023-08-07 NOTE — ED Provider Notes (Signed)
 [facility] Provider Note    Event Date/Time   First MD Initiated Contact with Patient 08/07/23 1641     (approximate)   History   Chief Complaint Palpitations   HPI  Sonya Gomez is a 73 y.o. female with past medical history of hypertension, hyperlipidemia, hypothyroidism, and anxiety who presents to the ED complaining of palpitations.  Patient reports that she has been having intermittent sensation of her heart racing for about the past 3 weeks, which became more prominent today.  She states that it tends to last for a couple of hours at a time, has resolved since she has arrived to the ED.  She denies any associated chest pain or shortness of breath and nothing in particular seems to make the symptoms worse.  She reports a history of atrial fibrillation 10 years ago due to dehydration, but has not been following with cardiology since then and was never started on a blood thinner.     Physical Exam   Triage Vital Signs: ED Triage Vitals  Encounter Vitals Group     BP 08/07/23 1404 132/84     Girls Systolic BP Percentile --      Girls Diastolic BP Percentile --      Boys Systolic BP Percentile --      Boys Diastolic BP Percentile --      Pulse Rate 08/07/23 1404 81     Resp 08/07/23 1404 16     Temp 08/07/23 1404 98.3 F (36.8 C)     Temp Source 08/07/23 1404 Oral     SpO2 08/07/23 1404 100 %     Weight 08/07/23 1333 187 lb (84.8 kg)     Height 08/07/23 1407 5' 1 (1.549 m)     Head Circumference --      Peak Flow --      Pain Score 08/07/23 1405 0     Pain Loc --      Pain Education --      Exclude from Growth Chart --     Most recent vital signs: Vitals:   08/07/23 1731 08/07/23 1731  BP: 129/69   Pulse: 72   Resp: 16   Temp: 98.5 F (36.9 C)   SpO2: 100% 100%    Constitutional: Alert and oriented. Eyes: Conjunctivae are normal. Head: Atraumatic. Nose: No congestion/rhinnorhea. Mouth/Throat: Mucous membranes are moist.   Cardiovascular: Normal rate, regular rhythm. Grossly normal heart sounds.  2+ radial pulses bilaterally. Respiratory: Normal respiratory effort.  No retractions. Lungs CTAB. Gastrointestinal: Soft and nontender. No distention. Musculoskeletal: No lower extremity tenderness nor edema.  Neurologic:  Normal speech and language. No gross focal neurologic deficits are appreciated.    ED Results / Procedures / Treatments   Labs (all labs ordered are listed, but only abnormal results are displayed) Labs Reviewed  COMPREHENSIVE METABOLIC PANEL WITH GFR - Abnormal; Notable for the following components:      Result Value   BUN 26 (*)    Creatinine, Ser 1.38 (*)    GFR, Estimated 41 (*)    All other components within normal limits  CBC WITH DIFFERENTIAL/PLATELET  TROPONIN I (HIGH SENSITIVITY)     EKG  ED ECG REPORT I, Carlin Palin, the attending physician, personally viewed and interpreted this ECG.   Date: 08/07/2023  EKG Time: 14:07  Rate: 82  Rhythm: normal sinus rhythm, PAC's noted  Axis: LAD  Intervals:Incomplete RBBB  ST&T Change: None  PROCEDURES:  Critical Care performed: No  Procedures   MEDICATIONS ORDERED IN ED: Medications - No data to display   IMPRESSION / MDM / ASSESSMENT AND PLAN / ED COURSE  I reviewed the triage vital signs and the nursing notes.                              73 y.o. female with past medical history of hypertension, hyperlipidemia, hypothyroidism, and anxiety who presents to the ED complaining of intermittent palpitations for the past 3 weeks, more prominent today but since resolved.  Patient's presentation is most consistent with acute presentation with potential threat to life or bodily function.  Differential diagnosis includes, but is not limited to, arrhythmia, ACS, anemia, electrolyte abnormality, AKI, anxiety.  Patient nontoxic-appearing and in no acute distress, vital signs are unremarkable.  EKG shows normal sinus rhythm  with PACs, no ischemic changes noted.  Labs without significant anemia, leukocytosis, electrolyte abnormality, or AKI.  We will add on troponin and observe on cardiac monitor.  Troponin within normal limits and no events noted on cardiac monitor.  Patient remains asymptomatic on reassessment and is appropriate for discharge home with outpatient cardiology follow-up.  She was counseled to return to the ED for new or worsening symptoms, patient agrees with plan.    FINAL CLINICAL IMPRESSION(S) / ED DIAGNOSES   Final diagnoses:  Palpitations     Rx / DC Orders   ED Discharge Orders          Ordered    Ambulatory referral to Cardiology        08/07/23 1835             Note:  This document was prepared using Dragon voice recognition software and may include unintentional dictation errors.   Willo Dunnings, MD 08/07/23 [phone]

## 2023-08-07 NOTE — ED Triage Notes (Signed)
 Pt to ED from [facility] for c/o rapid heart rate. Pt has hx Afib about ten years ago, but states it resolved. Pt denies thinners.

## 2023-08-26 DIAGNOSIS — R002 Palpitations: Secondary | ICD-10-CM | POA: Diagnosis not present

## 2023-08-26 DIAGNOSIS — Z8679 Personal history of other diseases of the circulatory system: Secondary | ICD-10-CM | POA: Diagnosis not present

## 2023-08-26 DIAGNOSIS — E782 Mixed hyperlipidemia: Secondary | ICD-10-CM | POA: Diagnosis not present

## 2023-08-26 DIAGNOSIS — R0602 Shortness of breath: Secondary | ICD-10-CM | POA: Diagnosis not present

## 2023-08-26 DIAGNOSIS — I10 Essential (primary) hypertension: Secondary | ICD-10-CM | POA: Diagnosis not present

## 2023-08-26 DIAGNOSIS — F419 Anxiety disorder, unspecified: Secondary | ICD-10-CM | POA: Diagnosis not present

## 2023-08-26 DIAGNOSIS — E039 Hypothyroidism, unspecified: Secondary | ICD-10-CM | POA: Diagnosis not present

## 2023-08-26 DIAGNOSIS — R7303 Prediabetes: Secondary | ICD-10-CM | POA: Diagnosis not present

## 2023-09-03 DIAGNOSIS — R0602 Shortness of breath: Secondary | ICD-10-CM | POA: Diagnosis not present

## 2023-09-03 DIAGNOSIS — R002 Palpitations: Secondary | ICD-10-CM | POA: Diagnosis not present

## 2023-09-09 DIAGNOSIS — R002 Palpitations: Secondary | ICD-10-CM | POA: Diagnosis not present

## 2023-09-21 DIAGNOSIS — E782 Mixed hyperlipidemia: Secondary | ICD-10-CM | POA: Diagnosis not present

## 2023-09-21 DIAGNOSIS — Z8679 Personal history of other diseases of the circulatory system: Secondary | ICD-10-CM | POA: Diagnosis not present

## 2023-09-21 DIAGNOSIS — I10 Essential (primary) hypertension: Secondary | ICD-10-CM | POA: Diagnosis not present

## 2023-09-21 DIAGNOSIS — R0602 Shortness of breath: Secondary | ICD-10-CM | POA: Diagnosis not present

## 2023-09-21 DIAGNOSIS — E039 Hypothyroidism, unspecified: Secondary | ICD-10-CM | POA: Diagnosis not present

## 2023-09-21 DIAGNOSIS — R7303 Prediabetes: Secondary | ICD-10-CM | POA: Diagnosis not present

## 2023-09-21 DIAGNOSIS — R000 Tachycardia, unspecified: Secondary | ICD-10-CM | POA: Diagnosis not present

## 2023-09-21 DIAGNOSIS — F419 Anxiety disorder, unspecified: Secondary | ICD-10-CM | POA: Diagnosis not present

## 2023-09-21 DIAGNOSIS — R002 Palpitations: Secondary | ICD-10-CM | POA: Diagnosis not present

## 2023-10-19 DIAGNOSIS — F419 Anxiety disorder, unspecified: Secondary | ICD-10-CM | POA: Diagnosis not present

## 2023-10-19 DIAGNOSIS — E782 Mixed hyperlipidemia: Secondary | ICD-10-CM | POA: Diagnosis not present

## 2023-10-19 DIAGNOSIS — I10 Essential (primary) hypertension: Secondary | ICD-10-CM | POA: Diagnosis not present

## 2023-10-19 DIAGNOSIS — Z8679 Personal history of other diseases of the circulatory system: Secondary | ICD-10-CM | POA: Diagnosis not present

## 2023-10-19 DIAGNOSIS — R0602 Shortness of breath: Secondary | ICD-10-CM | POA: Diagnosis not present

## 2023-10-19 DIAGNOSIS — R000 Tachycardia, unspecified: Secondary | ICD-10-CM | POA: Diagnosis not present

## 2023-10-19 DIAGNOSIS — M359 Systemic involvement of connective tissue, unspecified: Secondary | ICD-10-CM | POA: Diagnosis not present

## 2023-10-19 DIAGNOSIS — R002 Palpitations: Secondary | ICD-10-CM | POA: Diagnosis not present

## 2023-10-19 DIAGNOSIS — I071 Rheumatic tricuspid insufficiency: Secondary | ICD-10-CM | POA: Diagnosis not present

## 2023-10-23 NOTE — Progress Notes (Addendum)
 PREETI WINEGARDNER                                          MRN: [id number]   10/23/2023   The [facility] Quality Team Specialist reviewed this patient medical record for the purposes of chart review for care gap closure. The following were reviewed: abstraction for care gap closure-controlling blood pressure.  12/11/2023- ABSTRACTED MOST RECENT CBP    [facility] Quality Team

## 2023-11-11 DIAGNOSIS — I480 Paroxysmal atrial fibrillation: Secondary | ICD-10-CM | POA: Diagnosis not present

## 2023-11-11 DIAGNOSIS — R000 Tachycardia, unspecified: Secondary | ICD-10-CM | POA: Diagnosis not present

## 2023-11-11 DIAGNOSIS — R002 Palpitations: Secondary | ICD-10-CM | POA: Diagnosis not present
